# Patient Record
Sex: Female | Born: 1980 | Race: White | Hispanic: No | Marital: Married | Smoking: Current every day smoker
Health system: Southern US, Community
[De-identification: ages and names within clinical notes are randomized; demographics above are authoritative.]

## PROBLEM LIST (undated history)

## (undated) DIAGNOSIS — E039 Hypothyroidism, unspecified: Secondary | ICD-10-CM

## (undated) DIAGNOSIS — F419 Anxiety disorder, unspecified: Secondary | ICD-10-CM

## (undated) DIAGNOSIS — K219 Gastro-esophageal reflux disease without esophagitis: Secondary | ICD-10-CM

## (undated) DIAGNOSIS — F32A Depression, unspecified: Secondary | ICD-10-CM

## (undated) DIAGNOSIS — G932 Benign intracranial hypertension: Secondary | ICD-10-CM

## (undated) DIAGNOSIS — T8859XA Other complications of anesthesia, initial encounter: Secondary | ICD-10-CM

## (undated) DIAGNOSIS — I10 Essential (primary) hypertension: Secondary | ICD-10-CM

## (undated) DIAGNOSIS — Z87442 Personal history of urinary calculi: Secondary | ICD-10-CM

## (undated) HISTORY — PX: WISDOM TOOTH EXTRACTION: SHX21

---

## 2003-09-07 ENCOUNTER — Inpatient Hospital Stay (HOSPITAL_COMMUNITY): Admission: AD | Admit: 2003-09-07 | Discharge: 2003-09-08 | Payer: Self-pay | Admitting: Family Medicine

## 2016-05-12 ENCOUNTER — Ambulatory Visit (INDEPENDENT_AMBULATORY_CARE_PROVIDER_SITE_OTHER): Payer: BLUE CROSS/BLUE SHIELD | Admitting: Urology

## 2016-05-12 DIAGNOSIS — N200 Calculus of kidney: Secondary | ICD-10-CM | POA: Diagnosis not present

## 2016-05-12 DIAGNOSIS — R310 Gross hematuria: Secondary | ICD-10-CM

## 2016-05-14 ENCOUNTER — Other Ambulatory Visit: Payer: Self-pay | Admitting: Urology

## 2016-05-14 DIAGNOSIS — R310 Gross hematuria: Secondary | ICD-10-CM

## 2016-05-27 ENCOUNTER — Encounter (HOSPITAL_COMMUNITY): Payer: Self-pay | Admitting: Radiology

## 2016-05-27 ENCOUNTER — Ambulatory Visit (HOSPITAL_COMMUNITY)
Admission: RE | Admit: 2016-05-27 | Discharge: 2016-05-27 | Disposition: A | Discharge status: Home / Self Care | Payer: BLUE CROSS/BLUE SHIELD | Source: Ambulatory Visit | Attending: Urology | Admitting: Urology

## 2016-05-27 DIAGNOSIS — R310 Gross hematuria: Secondary | ICD-10-CM | POA: Insufficient documentation

## 2016-05-27 DIAGNOSIS — N132 Hydronephrosis with renal and ureteral calculous obstruction: Secondary | ICD-10-CM | POA: Diagnosis not present

## 2016-05-27 MED ORDER — IOPAMIDOL (ISOVUE-300) INJECTION 61%
INTRAVENOUS | Status: AC
  Administered 2016-05-27: 150 mL via INTRAVENOUS
  Filled 2016-05-27: qty 150

## 2016-05-27 MED ORDER — SODIUM CHLORIDE 0.9 % IV SOLN
INTRAVENOUS | Status: AC
  Filled 2016-05-27: qty 250

## 2016-06-02 ENCOUNTER — Other Ambulatory Visit: Payer: Self-pay | Admitting: Urology

## 2016-06-02 ENCOUNTER — Ambulatory Visit (INDEPENDENT_AMBULATORY_CARE_PROVIDER_SITE_OTHER): Payer: BLUE CROSS/BLUE SHIELD | Admitting: Urology

## 2016-06-02 DIAGNOSIS — N200 Calculus of kidney: Secondary | ICD-10-CM

## 2016-06-08 ENCOUNTER — Encounter (HOSPITAL_COMMUNITY): Payer: Self-pay | Admitting: *Deleted

## 2016-06-10 ENCOUNTER — Ambulatory Visit (HOSPITAL_COMMUNITY)
Admission: RE | Admit: 2016-06-10 | Discharge: 2016-06-10 | Disposition: A | Discharge status: Home / Self Care | Payer: BLUE CROSS/BLUE SHIELD | Source: Ambulatory Visit | Attending: Urology | Admitting: Urology

## 2016-06-10 ENCOUNTER — Encounter (HOSPITAL_COMMUNITY): Payer: Self-pay | Admitting: *Deleted

## 2016-06-10 ENCOUNTER — Encounter (HOSPITAL_COMMUNITY)
Admission: RE | Disposition: A | Discharge status: Home / Self Care | Payer: Self-pay | Source: Ambulatory Visit | Attending: Urology

## 2016-06-10 DIAGNOSIS — N201 Calculus of ureter: Secondary | ICD-10-CM | POA: Insufficient documentation

## 2016-06-10 DIAGNOSIS — Z87442 Personal history of urinary calculi: Secondary | ICD-10-CM | POA: Insufficient documentation

## 2016-06-10 DIAGNOSIS — E669 Obesity, unspecified: Secondary | ICD-10-CM | POA: Diagnosis not present

## 2016-06-10 DIAGNOSIS — I10 Essential (primary) hypertension: Secondary | ICD-10-CM | POA: Diagnosis not present

## 2016-06-10 DIAGNOSIS — Z6841 Body Mass Index (BMI) 40.0 and over, adult: Secondary | ICD-10-CM | POA: Insufficient documentation

## 2016-06-10 HISTORY — DX: Hypothyroidism, unspecified: E03.9

## 2016-06-10 HISTORY — DX: Personal history of urinary calculi: Z87.442

## 2016-06-10 HISTORY — PX: EXTRACORPOREAL SHOCK WAVE LITHOTRIPSY: SHX1557

## 2016-06-10 HISTORY — DX: Essential (primary) hypertension: I10

## 2016-06-10 HISTORY — DX: Benign intracranial hypertension: G93.2

## 2016-06-10 LAB — PREGNANCY, URINE: Preg Test, Ur: NEGATIVE

## 2016-06-10 SURGERY — LITHOTRIPSY, ESWL
Anesthesia: LOCAL | Laterality: Right

## 2016-06-10 MED ORDER — DIAZEPAM 5 MG PO TABS: 10.0000 mg | ORAL_TABLET | ORAL | Status: DC

## 2016-06-10 MED ORDER — CIPROFLOXACIN HCL 500 MG PO TABS
500.0000 mg | ORAL_TABLET | ORAL | Status: AC
  Administered 2016-06-10: 500 mg via ORAL
  Filled 2016-06-10: qty 1

## 2016-06-10 MED ORDER — DIPHENHYDRAMINE HCL 25 MG PO CAPS: 25.0000 mg | ORAL_CAPSULE | ORAL | Status: DC

## 2016-06-10 MED ORDER — SODIUM CHLORIDE 0.9 % IV SOLN
INTRAVENOUS | Status: DC
  Administered 2016-06-10: 10:00:00 via INTRAVENOUS

## 2016-06-11 ENCOUNTER — Encounter (HOSPITAL_COMMUNITY): Payer: Self-pay | Admitting: Urology

## 2016-06-23 ENCOUNTER — Other Ambulatory Visit (HOSPITAL_COMMUNITY)
Admission: RE | Admit: 2016-06-23 | Discharge: 2016-06-23 | Disposition: A | Discharge status: Home / Self Care | Payer: BLUE CROSS/BLUE SHIELD | Source: Ambulatory Visit | Attending: Urology | Admitting: Urology

## 2016-06-23 ENCOUNTER — Ambulatory Visit (INDEPENDENT_AMBULATORY_CARE_PROVIDER_SITE_OTHER): Payer: Self-pay | Admitting: Urology

## 2016-06-23 ENCOUNTER — Ambulatory Visit (HOSPITAL_COMMUNITY)
Admission: RE | Admit: 2016-06-23 | Discharge: 2016-06-23 | Disposition: A | Discharge status: Home / Self Care | Payer: BLUE CROSS/BLUE SHIELD | Source: Ambulatory Visit | Attending: Urology | Admitting: Urology

## 2016-06-23 ENCOUNTER — Other Ambulatory Visit: Payer: Self-pay | Admitting: Urology

## 2016-06-23 DIAGNOSIS — N200 Calculus of kidney: Secondary | ICD-10-CM

## 2016-07-02 LAB — STONE ANALYSIS
Ca Oxalate,Dihydrate: 10 %
Ca Oxalate,Monohydr.: 2 %
Ca phos cry stone ql IR: 88 %
Stone Weight KSTONE: 2.4 mg

## 2016-07-07 ENCOUNTER — Other Ambulatory Visit: Payer: Self-pay | Admitting: Urology

## 2016-07-07 ENCOUNTER — Ambulatory Visit (INDEPENDENT_AMBULATORY_CARE_PROVIDER_SITE_OTHER): Payer: Self-pay | Admitting: Urology

## 2016-07-07 ENCOUNTER — Ambulatory Visit (HOSPITAL_COMMUNITY)
Admission: RE | Admit: 2016-07-07 | Discharge: 2016-07-07 | Disposition: A | Discharge status: Home / Self Care | Payer: BLUE CROSS/BLUE SHIELD | Source: Ambulatory Visit | Attending: Urology | Admitting: Urology

## 2016-07-07 DIAGNOSIS — N201 Calculus of ureter: Secondary | ICD-10-CM | POA: Diagnosis not present

## 2016-07-07 DIAGNOSIS — N200 Calculus of kidney: Secondary | ICD-10-CM

## 2016-07-09 NOTE — Patient Instructions (Signed)
Nancy Villa  07/09/2016     @PREFPERIOPPHARMACY @   Your procedure is scheduled on  07/19/2016   Report to [facility] at  615  A.M.  Call this number if you have problems the morning of surgery:  [phone]   Remember:  Do not eat food or drink liquids after midnight.  Take these medicines the morning of surgery with A SIP OF WATER  Levothyroxine.   Do not wear jewelry, make-up or nail polish.  Do not wear lotions, powders, or perfumes, or deoderant.  Do not shave 48 hours prior to surgery.  Men may shave face and neck.  Do not bring valuables to the hospital.  [facility] is not responsible for any belongings or valuables.  Contacts, dentures or bridgework may not be worn into surgery.  Leave your suitcase in the car.  After surgery it may be brought to your room.  For patients admitted to the hospital, discharge time will be determined by your treatment team.  Patients discharged the day of surgery will not be allowed to drive home.   Name and phone number of your driver:   family Special instructions:  None  Please read over the following fact sheets that you were given. Anesthesia Post-op Instructions and Care and Recovery After Surgery       Cystoscopy Cystoscopy is a procedure that is used to help diagnose and sometimes treat conditions that affect that lower urinary tract. The lower urinary tract includes the bladder and the tube that drains urine from the bladder out of the body (urethra). Cystoscopy is performed with a thin, tube-shaped instrument with a light and camera at the end (cystoscope). The cystoscope may be hard (rigid) or flexible, depending on the goal of the procedure.The cystoscope is inserted through the urethra, into the bladder. Cystoscopy may be recommended if you have:  Urinary tractinfections that keep coming back (recurring).  Blood in the urine (hematuria).  Loss of bladder control (urinary  incontinence) or an overactive bladder.  Unusual cells found in a urine sample.  A blockage in the urethra.  Painful urination.  An abnormality in the bladder found during an intravenous pyelogram (IVP) or CT scan. Cystoscopy may also be done to remove a sample of tissue to be examined under a microscope (biopsy). Tell a health care provider about:  Any allergies you have.  All medicines you are taking, including vitamins, herbs, eye drops, creams, and over-the-counter medicines.  Any problems you or family members have had with anesthetic medicines.  Any blood disorders you have.  Any surgeries you have had.  Any medical conditions you have.  Whether you are pregnant or may be pregnant. What are the risks? Generally, this is a safe procedure. However, problems may occur, including:  Infection.  Bleeding.  Allergic reactions to medicines.  Damage to other structures or organs. What happens before the procedure?  Ask your health care provider about:  Changing or stopping your regular medicines. This is especially important if you are taking diabetes medicines or blood thinners.  Taking medicines such as aspirin and ibuprofen. These medicines can thin your blood. Do not take these medicines before your procedure if your health care provider instructs you not to.  Follow instructions from your health care provider about eating or drinking restrictions.  You may be given antibiotic medicine to help prevent infection.  You may have  an exam or testing, such as X-rays of the bladder, urethra, or kidneys.  You may have urine tests to check for signs of infection.  Plan to have someone take you home after the procedure. What happens during the procedure?  To reduce your risk of infection,your health care team will wash or sanitize their hands.  You will be given one or more of the following:  A medicine to help you relax (sedative).  A medicine to numb the area  (local anesthetic).  The area around the opening of your urethra will be cleaned.  The cystoscope will be passed through your urethra into your bladder.  Germ-free (sterile)fluid will flow through the cystoscope to fill your bladder. The fluid will stretch your bladder so that your surgeon can clearly examine your bladder walls.  The cystoscope will be removed and your bladder will be emptied. The procedure may vary among health care providers and hospitals. What happens after the procedure?  You may have some soreness or pain in your abdomen and urethra. Medicines will be available to help you.  You may have some blood in your urine.  Do not drive for 24 hours if you received a sedative. This information is not intended to replace advice given to you by your health care provider. Make sure you discuss any questions you have with your health care provider. Document Released: 02/27/2000 Document Revised: 07/10/2015 Document Reviewed: 01/16/2015 Elsevier Interactive Patient Education  2017 [employer]  Cystoscopy, Care After Refer to this sheet in the next few weeks. These instructions provide you with information about caring for yourself after your procedure. Your health care provider may also give you more specific instructions. Your treatment has been planned according to current medical practices, but problems sometimes occur. Call your health care provider if you have any problems or questions after your procedure. What can I expect after the procedure? After the procedure, it is common to have:  Mild pain when you urinate. Pain should stop within a few minutes after you urinate. This may last for up to 1 week.  A small amount of blood in your urine for several days.  Feeling like you need to urinate but producing only a small amount of urine. Follow these instructions at home:   Medicines   Take over-the-counter and prescription medicines only as told by your health care  provider.  If you were prescribed an antibiotic medicine, take it as told by your health care provider. Do not stop taking the antibiotic even if you start to feel better. General instructions    Return to your normal activities as told by your health care provider. Ask your health care provider what activities are safe for you.  Do not drive for 24 hours if you received a sedative.  Watch for any blood in your urine. If the amount of blood in your urine increases, call your health care provider.  Follow instructions from your health care provider about eating or drinking restrictions.  If a tissue sample was removed for testing (biopsy) during your procedure, it is your responsibility to get your test results. Ask your health care provider or the department performing the test when your results will be ready.  Drink enough fluid to keep your urine clear or pale yellow.  Keep all follow-up visits as told by your health care provider. This is important. Contact a health care provider if:  You have pain that gets worse or does not get better with medicine, especially  pain when you urinate.  You have difficulty urinating. Get help right away if:  You have more blood in your urine.  You have blood clots in your urine.  You have abdominal pain.  You have a fever or chills.  You are unable to urinate. This information is not intended to replace advice given to you by your health care provider. Make sure you discuss any questions you have with your health care provider. Document Released: 09/18/2004 Document Revised: 08/07/2015 Document Reviewed: 01/16/2015 Elsevier Interactive Patient Education  2017 [employer]  Ureteral Stent Implantation Ureteral stent implantation is a procedure to insert (implant) a flexible, soft, plastic tube (stent) into a tube (ureter) that drains urine from the kidneys. The stent supports the ureter while it heals and helps to drain urine from the  kidneys. You may have a ureteral stent implanted after having a procedure to remove a blockage from the ureter (ureterolysis or pyeloplasty).You may also have a stent implanted to open the flow of urine when you have a blockage caused by a kidney stone, tumor, blood clot, or infection. You have two ureters, one on each side of the body. The ureters connect the kidneys to the organ that holds urine until it passes out of the body (bladder). The stent is placed so that one end is in the kidney, and one end is in the bladder. The stent is usually taken out after your ureter has healed. Depending on your condition, you may have a stent for just a few weeks, or you may have a long-term stent that will need to be replaced every few months. Tell a health care provider about:  Any allergies you have.  All medicines you are taking, including vitamins, herbs, eye drops, creams, and over-the-counter medicines.  Any problems you or family members have had with anesthetic medicines.  Any blood disorders you have.  Any surgeries you have had.  Any medical conditions you have.  Whether you are pregnant or may be pregnant. What are the risks? Generally, this is a safe procedure. However, problems may occur, including:  Infection.  Bleeding.  Allergic reactions to medicines.  Damage to other structures or organs. Tearing (perforation) of the ureter is possible.  Movement of the stent away from where it is placed during surgery (migration). What happens before the procedure?  Ask your health care provider about:  Changing or stopping your regular medicines. This is especially important if you are taking diabetes medicines or blood thinners.  Taking medicines such as aspirin and ibuprofen. These medicines can thin your blood. Do not take these medicines before your procedure if your health care provider instructs you not to.  Follow instructions from your health care provider about eating or  drinking restrictions.  Do not drink alcohol and do not use any tobacco products before your procedure, as told by your health care provider.  You may be given antibiotic medicine to help prevent infection.  Plan to have someone take you home after the procedure.  If you go home right after the procedure, plan to have someone with you for 24 hours. What happens during the procedure?  An IV tube will be inserted into one of your veins.  You will be given a medicine to make you fall asleep (general anesthetic). You may also be given a medicine to help you relax (sedative).  A thin, tube-shaped instrument with a light and tiny camera at the end (cystoscope) will be inserted into your urethra. The urethra  is the tube that drains urine from the bladder out of the body. In men, the urethra opens at the end of the penis. In women, the urethra opens in front of the vaginal opening.  The cystoscope will be passed into your bladder.  A thin wire (guide wire) will be passed through your bladder and into your ureter. This is used to guide the stent into your ureter.  The stent will be inserted into your ureter.  The guide wire and the cystoscope will be removed.  A flexible tube (catheter) will be inserted through your urethra so that one end is in your bladder. This helps to drain urine from your bladder. The procedure may vary among hospitals and health care providers. What happens after the procedure?  Your blood pressure, heart rate, breathing rate, and blood oxygen level will be monitored often until the medicines you were given have worn off.  You may continue to receive medicine and fluids through an IV tube.  You may have some soreness or pain in your abdomen and urethra. Medicines will be available to help you.  You will be encouraged to get up and walk around as soon as you can.  You will have a catheter draining your urine.  You will have some blood in your urine.  Do not  drive for 24 hours if you received a sedative. This information is not intended to replace advice given to you by your health care provider. Make sure you discuss any questions you have with your health care provider. Document Released: 02/27/2000 Document Revised: 08/07/2015 Document Reviewed: 09/13/2014 Elsevier Interactive Patient Education  2017 [employer] Inc.  Ureteral Stent Implantation, Care After Refer to this sheet in the next few weeks. These instructions provide you with information about caring for yourself after your procedure. Your health care provider may also give you more specific instructions. Your treatment has been planned according to current medical practices, but problems sometimes occur. Call your health care provider if you have any problems or questions after your procedure. What can I expect after the procedure? After the procedure, it is common to have:  Nausea.  Mild pain when you urinate. You may feel this pain in your lower back or lower abdomen. Pain should stop within a few minutes after you urinate. This may last for up to 1 week.  A small amount of blood in your urine for several days. Follow these instructions at home:   Medicines   Take over-the-counter and prescription medicines only as told by your health care provider.  If you were prescribed an antibiotic medicine, take it as told by your health care provider. Do not stop taking the antibiotic even if you start to feel better.  Do not drive for 24 hours if you received a sedative.  Do not drive or operate heavy machinery while taking prescription pain medicines. Activity   Return to your normal activities as told by your health care provider. Ask your health care provider what activities are safe for you.  Do not lift anything that is heavier than 10 lb (4.5 kg). Follow this limit for 1 week after your procedure, or for as long as told by your health care provider. General instructions   Watch  for any blood in your urine. Call your health care provider if the amount of blood in your urine increases.  If you have a catheter:  Follow instructions from your health care provider about taking care of your catheter and collection  bag.  Do not take baths, swim, or use a hot tub until your health care provider approves.  Drink enough fluid to keep your urine clear or pale yellow.  Keep all follow-up visits as told by your health care provider. This is important. Contact a health care provider if:  You have pain that gets worse or does not get better with medicine, especially pain when you urinate.  You have difficulty urinating.  You feel nauseous or you vomit repeatedly during a period of more than 2 days after the procedure. Get help right away if:  Your urine is dark red or has blood clots in it.  You are leaking urine (have incontinence).  The end of the stent comes out of your urethra.  You cannot urinate.  You have sudden, sharp, or severe pain in your abdomen or lower back.  You have a fever. This information is not intended to replace advice given to you by your health care provider. Make sure you discuss any questions you have with your health care provider. Document Released: 11/01/2012 Document Revised: 08/07/2015 Document Reviewed: 09/13/2014 Elsevier Interactive Patient Education  2017 [employer]  Kidney Stones Kidney stones (urolithiasis) are rock-like masses that form inside of the kidneys. Kidneys are organs that make pee (urine). A kidney stone can cause very bad pain and can block the flow of pee. The stone usually leaves your body (passes) through your pee. You may need to have a doctor take out the stone. Follow these instructions at home: Eating and drinking   Drink enough fluid to keep your pee clear or pale yellow. This will help you pass the stone.  If told by your doctor, change the foods you eat (your diet). This may include:  Limiting how  much salt (sodium) you eat.  Eating more fruits and vegetables.  Limiting how much meat, poultry, fish, and eggs you eat.  Follow instructions from your doctor about eating or drinking restrictions. General instructions   Collect pee samples as told by your doctor. You may need to collect a pee sample:  24 hours after a stone comes out.  8-12 weeks after a stone comes out, and every 6-12 months after that.  Strain your pee every time you pee (urinate), for as long as told. Use the strainer that your doctor recommends.  Do not throw out the stone. Keep it so that it can be tested by your doctor.  Take over-the-counter and prescription medicines only as told by your doctor.  Keep all follow-up visits as told by your doctor. This is important. You may need follow-up tests. Preventing kidney stones  To prevent another kidney stone:  Drink enough fluid to keep your pee clear or pale yellow. This is the best way to prevent kidney stones.  Eat healthy foods.  Avoid certain foods as told by your doctor. You may be told to eat less protein.  Stay at a healthy weight. Contact a doctor if:  You have pain that gets worse or does not get better with medicine. Get help right away if:  You have a fever or chills.  You get very bad pain.  You get new pain in your belly (abdomen).  You pass out (faint).  You cannot pee. This information is not intended to replace advice given to you by your health care provider. Make sure you discuss any questions you have with your health care provider. Document Released: 08/18/2007 Document Revised: 11/18/2015 Document Reviewed: 11/18/2015 Elsevier Interactive Patient  Education  2017 [employer]  Kidney Stones Kidney stones (urolithiasis) are rock-like masses that form inside of the kidneys. Kidneys are organs that make pee (urine). A kidney stone can cause very bad pain and can block the flow of pee. The stone usually leaves your body (passes)  through your pee. You may need to have a doctor take out the stone. Follow these instructions at home: Eating and drinking   Drink enough fluid to keep your pee clear or pale yellow. This will help you pass the stone.  If told by your doctor, change the foods you eat (your diet). This may include:  Limiting how much salt (sodium) you eat.  Eating more fruits and vegetables.  Limiting how much meat, poultry, fish, and eggs you eat.  Follow instructions from your doctor about eating or drinking restrictions. General instructions   Collect pee samples as told by your doctor. You may need to collect a pee sample:  24 hours after a stone comes out.  8-12 weeks after a stone comes out, and every 6-12 months after that.  Strain your pee every time you pee (urinate), for as long as told. Use the strainer that your doctor recommends.  Do not throw out the stone. Keep it so that it can be tested by your doctor.  Take over-the-counter and prescription medicines only as told by your doctor.  Keep all follow-up visits as told by your doctor. This is important. You may need follow-up tests. Preventing kidney stones  To prevent another kidney stone:  Drink enough fluid to keep your pee clear or pale yellow. This is the best way to prevent kidney stones.  Eat healthy foods.  Avoid certain foods as told by your doctor. You may be told to eat less protein.  Stay at a healthy weight. Contact a doctor if:  You have pain that gets worse or does not get better with medicine. Get help right away if:  You have a fever or chills.  You get very bad pain.  You get new pain in your belly (abdomen).  You pass out (faint).  You cannot pee. This information is not intended to replace advice given to you by your health care provider. Make sure you discuss any questions you have with your health care provider. Document Released: 08/18/2007 Document Revised: 11/18/2015 Document Reviewed:  11/18/2015 Elsevier Interactive Patient Education  2017 [employer]  Ureteroscopy Ureteroscopy is a procedure to check for and treat problems inside part of the urinary tract. In this procedure, a thin, tube-shaped instrument with a light at the end (ureteroscope) is used to look at the inside of the kidneys and the ureters, which are the tubes that carry urine from the kidneys to the bladder. The ureteroscope is inserted into one or both of the ureters. You may need this procedure if you have frequent urinary tract infections (UTIs), blood in your urine, or a stone in one of your ureters. A ureteroscopy can be done to find the cause of urine blockage in a ureter and to evaluate other abnormalities inside the ureters or kidneys. If stones are found, they can be removed during the procedure. Polyps, abnormal tissue, and some types of tumors can also be removed or treated. The ureteroscope may also have a tool to remove tissue to be checked for disease under a microscope (biopsy). Tell a health care provider about:  Any allergies you have.  All medicines you are taking, including vitamins, herbs, eye drops, creams, and  over-the-counter medicines.  Any problems you or family members have had with anesthetic medicines.  Any blood disorders you have.  Any surgeries you have had.  Any medical conditions you have.  Whether you are pregnant or may be pregnant. What are the risks? Generally, this is a safe procedure. However, problems may occur, including:  Bleeding.  Infection.  Allergic reactions to medicines.  Scarring that narrows the ureter (stricture).  Creating a hole in the ureter (perforation). What happens before the procedure? Staying hydrated  Follow instructions from your health care provider about hydration, which may include:  Up to 2 hours before the procedure - you may continue to drink clear liquids, such as water, clear fruit juice, black coffee, and plain  tea. Eating and drinking restrictions  Follow instructions from your health care provider about eating and drinking, which may include:  8 hours before the procedure - stop eating heavy meals or foods such as meat, fried foods, or fatty foods.  6 hours before the procedure - stop eating light meals or foods, such as toast or cereal.  6 hours before the procedure - stop drinking milk or drinks that contain milk.  2 hours before the procedure - stop drinking clear liquids. Medicines   Ask your health care provider about:  Changing or stopping your regular medicines. This is especially important if you are taking diabetes medicines or blood thinners.  Taking medicines such as aspirin and ibuprofen. These medicines can thin your blood. Do not take these medicines before your procedure if your health care provider instructs you not to.  You may be given antibiotic medicine to help prevent infection. General instructions   You may have a urine sample taken to check for infection.  Plan to have someone take you home from the hospital or clinic. What happens during the procedure?  To reduce your risk of infection:  Your health care team will wash or sanitize their hands.  Your skin will be washed with soap.  An IV tube will be inserted into one of your veins.  You will be given one of the following:  A medicine to help you relax (sedative).  A medicine to make you fall asleep (general anesthetic).  A medicine that is injected into your spine to numb the area below and slightly above the injection site (spinal anesthetic).  To lower your risk of infection, you may be given an antibiotic medicine by an injection or through the IV tube.  The opening from which you urinate (urethra) will be cleaned with a germ-killing solution.  The ureteroscope will be passed through your urethra into your bladder.  A salt-water solution will flow through the ureteroscope to fill your bladder.  This will help the health care provider see the openings of your ureters more clearly.  Then, the ureteroscope will be passed into your ureter.  If a growth is found, a piece of it may be removed so it can be examined under a microscope (biopsy).  If a stone is found, it may be removed through the ureteroscope, or the stone may be broken up using a laser, shock waves, or electrical energy.  In some cases, if the ureter is too small, a tube may be inserted that keeps the ureter open (ureteral stent). The stent may be left in place for 1 or 2 weeks to keep the ureter open, and then the ureteroscopy procedure will be performed.  The scope will be removed, and your bladder will be emptied.  The procedure may vary among health care providers and hospitals. What happens after the procedure?  Your blood pressure, heart rate, breathing rate, and blood oxygen level will be monitored until the medicines you were given have worn off.  You may be asked to urinate.  Donot drive for 24 hours if you were given a sedative. This information is not intended to replace advice given to you by your health care provider. Make sure you discuss any questions you have with your health care provider. Document Released: 03/06/2013 Document Revised: 12/16/2015 Document Reviewed: 12/12/2015 Elsevier Interactive Patient Education  2017 [employer]  Ureteroscopy, Care After This sheet gives you information about how to care for yourself after your procedure. Your health care provider may also give you more specific instructions. If you have problems or questions, contact your health care provider. What can I expect after the procedure? After the procedure, it is common to have:  A burning sensation when you urinate.  Blood in your urine.  Mild discomfort in the bladder area or kidney area when urinating.  Needing to urinate more often or urgently. Follow these instructions at home: Medicines   Take  over-the-counter and prescription medicines only as told by your health care provider.  If you were prescribed an antibiotic medicine, take it as told by your health care provider. Do not stop taking the antibiotic even if you start to feel better. General instructions    Donot drive for 24 hours if you were given a medicine to help you relax (sedative) during your procedure.  To relieve burning, try taking a warm bath or holding a warm washcloth over your groin.  Drink enough fluid to keep your urine clear or pale yellow.  Drink two 8-ounce glasses of water every hour for the first 2 hours after you get home.  Continue to drink water often at home.  You can eat what you usually do.  Keep all follow-up visits as told by your health care provider. This is important.  If you had a tube placed to keep urine flowing (ureteral stent), ask your health care provider when you need to return to have it removed. Contact a health care provider if:  You have chills or a fever.  You have burning pain for longer than 24 hours after the procedure.  You have blood in your urine for longer than 24 hours after the procedure. Get help right away if:  You have large amounts of blood in your urine.  You have blood clots in your urine.  You have very bad pain.  You have chest pain or trouble breathing.  You are unable to urinate and you have the feeling of a full bladder. This information is not intended to replace advice given to you by your health care provider. Make sure you discuss any questions you have with your health care provider. Document Released: 03/06/2013 Document Revised: 12/16/2015 Document Reviewed: 12/12/2015 Elsevier Interactive Patient Education  2017 [employer] PATIENT INSTRUCTIONS POST-ANESTHESIA  IMMEDIATELY FOLLOWING SURGERY:  Do not drive or operate machinery for the first twenty four hours after surgery.  Do not make any important decisions for twenty four hours  after surgery or while taking narcotic pain medications or sedatives.  If you develop intractable nausea and vomiting or a severe headache please notify your doctor immediately.  FOLLOW-UP:  Please make an appointment with your surgeon as instructed. You do not need to follow up with anesthesia unless specifically instructed to do so.  WOUND CARE INSTRUCTIONS (if applicable):  Keep a dry clean dressing on the anesthesia/puncture wound site if there is drainage.  Once the wound has quit draining you may leave it open to air.  Generally you should leave the bandage intact for twenty four hours unless there is drainage.  If the epidural site drains for more than 36-48 hours please call the anesthesia department.  QUESTIONS?:  Please feel free to call your physician or the hospital operator if you have any questions, and they will be happy to assist you.

## 2016-07-13 ENCOUNTER — Encounter (HOSPITAL_COMMUNITY)
Admission: RE | Admit: 2016-07-13 | Discharge: 2016-07-13 | Disposition: A | Discharge status: Home / Self Care | Payer: BLUE CROSS/BLUE SHIELD | Source: Ambulatory Visit | Attending: Urology | Admitting: Urology

## 2016-07-13 ENCOUNTER — Encounter (HOSPITAL_COMMUNITY): Payer: Self-pay

## 2016-07-13 ENCOUNTER — Other Ambulatory Visit (HOSPITAL_COMMUNITY): Payer: BLUE CROSS/BLUE SHIELD

## 2016-07-13 DIAGNOSIS — Z01812 Encounter for preprocedural laboratory examination: Secondary | ICD-10-CM | POA: Diagnosis present

## 2016-07-13 DIAGNOSIS — Z01810 Encounter for preprocedural cardiovascular examination: Secondary | ICD-10-CM | POA: Insufficient documentation

## 2016-07-13 LAB — CBC WITH DIFFERENTIAL/PLATELET
Basophils Absolute: 0.1 10*3/uL (ref 0.0–0.1)
Basophils Relative: 1 %
Eosinophils Absolute: 0.5 10*3/uL (ref 0.0–0.7)
Eosinophils Relative: 5 %
HEMATOCRIT: 43.4 % (ref 36.0–46.0)
HEMOGLOBIN: 14.7 g/dL (ref 12.0–15.0)
LYMPHS ABS: 2.9 10*3/uL (ref 0.7–4.0)
Lymphocytes Relative: 29 %
MCH: 31.5 pg (ref 26.0–34.0)
MCHC: 33.9 g/dL (ref 30.0–36.0)
MCV: 93.1 fL (ref 78.0–100.0)
MONOS PCT: 8 %
Monocytes Absolute: 0.8 10*3/uL (ref 0.1–1.0)
NEUTROS ABS: 5.7 10*3/uL (ref 1.7–7.7)
NEUTROS PCT: 57 %
Platelets: 322 10*3/uL (ref 150–400)
RBC: 4.66 MIL/uL (ref 3.87–5.11)
RDW: 12.9 % (ref 11.5–15.5)
WBC: 10 10*3/uL (ref 4.0–10.5)

## 2016-07-13 LAB — BASIC METABOLIC PANEL
Anion gap: 6 (ref 5–15)
BUN: 10 mg/dL (ref 6–20)
CHLORIDE: 106 mmol/L (ref 101–111)
CO2: 23 mmol/L (ref 22–32)
CREATININE: 0.82 mg/dL (ref 0.44–1.00)
Calcium: 9 mg/dL (ref 8.9–10.3)
GFR calc Af Amer: >60 mL/min (ref >60)
GFR calc non Af Amer: >60 mL/min (ref >60)
GLUCOSE: 99 mg/dL (ref 65–99)
Potassium: 3.6 mmol/L (ref 3.5–5.1)
Sodium: 135 mmol/L (ref 135–145)

## 2016-07-13 LAB — HCG, SERUM, QUALITATIVE: Preg, Serum: NEGATIVE

## 2016-07-19 ENCOUNTER — Ambulatory Visit (HOSPITAL_COMMUNITY): Payer: BLUE CROSS/BLUE SHIELD

## 2016-07-19 ENCOUNTER — Encounter (HOSPITAL_COMMUNITY): Payer: Self-pay | Admitting: *Deleted

## 2016-07-19 ENCOUNTER — Encounter (HOSPITAL_COMMUNITY)
Admission: RE | Disposition: A | Discharge status: Home / Self Care | Payer: Self-pay | Source: Ambulatory Visit | Attending: Urology

## 2016-07-19 ENCOUNTER — Ambulatory Visit (HOSPITAL_COMMUNITY): Payer: BLUE CROSS/BLUE SHIELD | Admitting: Anesthesiology

## 2016-07-19 ENCOUNTER — Ambulatory Visit (HOSPITAL_COMMUNITY)
Admission: RE | Admit: 2016-07-19 | Discharge: 2016-07-19 | Disposition: A | Discharge status: Home / Self Care | Payer: BLUE CROSS/BLUE SHIELD | Source: Ambulatory Visit | Attending: Urology | Admitting: Urology

## 2016-07-19 DIAGNOSIS — F17210 Nicotine dependence, cigarettes, uncomplicated: Secondary | ICD-10-CM | POA: Insufficient documentation

## 2016-07-19 DIAGNOSIS — Z87442 Personal history of urinary calculi: Secondary | ICD-10-CM | POA: Diagnosis not present

## 2016-07-19 DIAGNOSIS — I10 Essential (primary) hypertension: Secondary | ICD-10-CM | POA: Insufficient documentation

## 2016-07-19 DIAGNOSIS — E039 Hypothyroidism, unspecified: Secondary | ICD-10-CM | POA: Insufficient documentation

## 2016-07-19 DIAGNOSIS — N201 Calculus of ureter: Secondary | ICD-10-CM

## 2016-07-19 DIAGNOSIS — N132 Hydronephrosis with renal and ureteral calculous obstruction: Secondary | ICD-10-CM | POA: Diagnosis not present

## 2016-07-19 HISTORY — PX: CYSTOSCOPY WITH RETROGRADE PYELOGRAM, URETEROSCOPY AND STENT PLACEMENT: SHX5789

## 2016-07-19 HISTORY — PX: HOLMIUM LASER APPLICATION: SHX5852

## 2016-07-19 SURGERY — CYSTOURETEROSCOPY, WITH RETROGRADE PYELOGRAM AND STENT INSERTION
Anesthesia: General | Laterality: Right

## 2016-07-19 MED ORDER — OXYBUTYNIN CHLORIDE 5 MG PO TABS: 5.0000 mg | ORAL_TABLET | Freq: Three times a day (TID) | ORAL | 0 refills | Status: DC | PRN

## 2016-07-19 MED ORDER — PHENAZOPYRIDINE HCL 100 MG PO TABS: 100.0000 mg | ORAL_TABLET | Freq: Three times a day (TID) | ORAL | 0 refills | Status: DC | PRN

## 2016-07-19 MED ORDER — FENTANYL CITRATE (PF) 100 MCG/2ML IJ SOLN
INTRAMUSCULAR | Status: AC
  Filled 2016-07-19: qty 4

## 2016-07-19 MED ORDER — SODIUM CHLORIDE 0.9 % IJ SOLN
INTRAMUSCULAR | Status: AC
  Filled 2016-07-19: qty 10

## 2016-07-19 MED ORDER — LACTATED RINGERS IV SOLN
INTRAVENOUS | Status: DC
  Administered 2016-07-19 (×2): via INTRAVENOUS

## 2016-07-19 MED ORDER — SODIUM CHLORIDE 0.9 % IR SOLN
Status: DC | PRN
  Administered 2016-07-19 (×2): 3000 mL

## 2016-07-19 MED ORDER — MIDAZOLAM HCL 2 MG/2ML IJ SOLN
1.0000 mg | INTRAMUSCULAR | Status: AC
Start: 2016-07-19 — End: 2016-07-19
  Administered 2016-07-19: 2 mg via INTRAVENOUS

## 2016-07-19 MED ORDER — DIATRIZOATE MEGLUMINE 30 % UR SOLN
URETHRAL | Status: AC
  Filled 2016-07-19: qty 300

## 2016-07-19 MED ORDER — ONDANSETRON HCL 4 MG/2ML IJ SOLN
INTRAMUSCULAR | Status: AC
  Filled 2016-07-19: qty 2

## 2016-07-19 MED ORDER — ONDANSETRON HCL 4 MG/2ML IJ SOLN
4.0000 mg | Freq: Once | INTRAMUSCULAR | Status: AC
  Administered 2016-07-19: 4 mg via INTRAVENOUS

## 2016-07-19 MED ORDER — MIDAZOLAM HCL 2 MG/2ML IJ SOLN
INTRAMUSCULAR | Status: AC
  Filled 2016-07-19: qty 2

## 2016-07-19 MED ORDER — EPHEDRINE SULFATE 50 MG/ML IJ SOLN
INTRAMUSCULAR | Status: AC
  Filled 2016-07-19: qty 1

## 2016-07-19 MED ORDER — TAMSULOSIN HCL 0.4 MG PO CAPS: 0.4000 mg | ORAL_CAPSULE | Freq: Every day | ORAL | 0 refills | Status: DC

## 2016-07-19 MED ORDER — STERILE WATER FOR IRRIGATION IR SOLN
Status: DC | PRN
  Administered 2016-07-19: 1000 mL

## 2016-07-19 MED ORDER — PROPOFOL 10 MG/ML IV BOLUS
INTRAVENOUS | Status: DC | PRN
  Administered 2016-07-19: 150 mg via INTRAVENOUS

## 2016-07-19 MED ORDER — LIDOCAINE HCL 1 % IJ SOLN
INTRAMUSCULAR | Status: DC | PRN
  Administered 2016-07-19: 30 mg via INTRADERMAL

## 2016-07-19 MED ORDER — DIAZEPAM 5 MG PO TABS: 5.0000 mg | ORAL_TABLET | Freq: Three times a day (TID) | ORAL | 0 refills | Status: DC | PRN

## 2016-07-19 MED ORDER — FENTANYL CITRATE (PF) 100 MCG/2ML IJ SOLN
INTRAMUSCULAR | Status: DC | PRN
  Administered 2016-07-19: 25 ug via INTRAVENOUS
  Administered 2016-07-19: 50 ug via INTRAVENOUS
  Administered 2016-07-19: 25 ug via INTRAVENOUS

## 2016-07-19 MED ORDER — LIDOCAINE HCL (PF) 1 % IJ SOLN
INTRAMUSCULAR | Status: AC
  Filled 2016-07-19: qty 5

## 2016-07-19 MED ORDER — CEFAZOLIN SODIUM-DEXTROSE 2-4 GM/100ML-% IV SOLN
2.0000 g | INTRAVENOUS | Status: AC
  Administered 2016-07-19: 2 g via INTRAVENOUS
  Filled 2016-07-19: qty 100

## 2016-07-19 MED ORDER — PROPOFOL 10 MG/ML IV BOLUS
INTRAVENOUS | Status: AC
  Filled 2016-07-19: qty 40

## 2016-07-19 MED ORDER — OXYCODONE-ACETAMINOPHEN 10-325 MG PO TABS: 1.0000 | ORAL_TABLET | ORAL | 0 refills | Status: DC | PRN

## 2016-07-19 MED ORDER — MIDAZOLAM HCL 5 MG/5ML IJ SOLN
INTRAMUSCULAR | Status: DC | PRN
  Administered 2016-07-19: 2 mg via INTRAVENOUS

## 2016-07-19 MED ORDER — DIATRIZOATE MEGLUMINE 30 % UR SOLN
URETHRAL | Status: DC | PRN
  Administered 2016-07-19: 4 mL
  Administered 2016-07-19: 10 mL

## 2016-07-19 MED ORDER — HYDROMORPHONE HCL 1 MG/ML IJ SOLN: 0.2500 mg | INTRAMUSCULAR | Status: DC | PRN

## 2016-07-19 SURGICAL SUPPLY — 27 items
BAG DRAIN CYSTO-URO STER (DRAIN) ×3 IMPLANT
BAG HAMPER (MISCELLANEOUS) ×3 IMPLANT
CATH FOLEY 2WAY  3CC  8FR (CATHETERS) ×2
CATH FOLEY 2WAY 3CC 8FR (CATHETERS) IMPLANT
CATH INTERMIT  6FR 70CM (CATHETERS) ×2 IMPLANT
CLOTH BEACON ORANGE TIMEOUT ST (SAFETY) ×3 IMPLANT
EXTRACTOR STONE NITINOL NGAGE (UROLOGICAL SUPPLIES) ×3 IMPLANT
FIBER LASER FLEXIVA 200 (UROLOGICAL SUPPLIES) ×3 IMPLANT
GLOVE BIO SURGEON STRL SZ8 (GLOVE) ×3 IMPLANT
GLOVE BIOGEL PI IND STRL 6.5 (GLOVE) IMPLANT
GLOVE BIOGEL PI IND STRL 7.0 (GLOVE) IMPLANT
GLOVE BIOGEL PI INDICATOR 6.5 (GLOVE) ×2
GLOVE BIOGEL PI INDICATOR 7.0 (GLOVE) ×2
GLOVE SURG SS PI 6.5 STRL IVOR (GLOVE) ×2 IMPLANT
GOWN STRL REUS W/ TWL XL LVL3 (GOWN DISPOSABLE) ×1 IMPLANT
GOWN STRL REUS W/TWL LRG LVL3 (GOWN DISPOSABLE) ×3 IMPLANT
GOWN STRL REUS W/TWL XL LVL3 (GOWN DISPOSABLE) ×3
GUIDEWIRE ANG ZIPWIRE 038X150 (WIRE) ×3 IMPLANT
GUIDEWIRE STR DUAL SENSOR (WIRE) ×2 IMPLANT
GUIDEWIRE STR ZIPWIRE 035X150 (MISCELLANEOUS) ×3 IMPLANT
IV NS IRRIG 3000ML ARTHROMATIC (IV SOLUTION) ×5 IMPLANT
MANIFOLD NEPTUNE II (INSTRUMENTS) ×2 IMPLANT
PACK CYSTO (CUSTOM PROCEDURE TRAY) ×3 IMPLANT
PAD ARMBOARD 7.5X6 YLW CONV (MISCELLANEOUS) ×3 IMPLANT
STENT URET 6FRX26 CONTOUR (STENTS) ×2 IMPLANT
SYRINGE 10CC LL (SYRINGE) ×3 IMPLANT
TOWEL OR 17X26 4PK STRL BLUE (TOWEL DISPOSABLE) ×3 IMPLANT

## 2016-07-19 NOTE — Anesthesia Procedure Notes (Signed)
Procedure Name: LMA Insertion Date/Time: 07/19/2016 7:44 AM Performed by: Despina HiddenIDACAVAGE, Heliodoro Domagalski J Pre-anesthesia Checklist: Patient identified, Patient being monitored, Emergency Drugs available, Timeout performed and Suction available Patient Re-evaluated:Patient Re-evaluated prior to inductionOxygen Delivery Method: Circle System Utilized Preoxygenation: Pre-oxygenation with 100% oxygen Intubation Type: IV induction Ventilation: Mask ventilation without difficulty LMA: LMA inserted LMA Size: 4.0 Number of attempts: 1 Placement Confirmation: positive ETCO2 and breath sounds checked- equal and bilateral Tube secured with: Tape Dental Injury: Teeth and Oropharynx as per pre-operative assessment

## 2016-07-19 NOTE — Discharge Instructions (Signed)
Ureteral Stent Implantation, Care After °Refer to this sheet in the next few weeks. These instructions provide you with information about caring for yourself after your procedure. Your health care provider may also give you more specific instructions. Your treatment has been planned according to current medical practices, but problems sometimes occur. Call your health care provider if you have any problems or questions after your procedure. °What can I expect after the procedure? °After the procedure, it is common to have: °· Nausea. °· Mild pain when you urinate. You may feel this pain in your lower back or lower abdomen. Pain should stop within a few minutes after you urinate. This may last for up to 1 week. °· A small amount of blood in your urine for several days. °Follow these instructions at home: °  °Medicines  °· Take over-the-counter and prescription medicines only as told by your health care provider. °· If you were prescribed an antibiotic medicine, take it as told by your health care provider. Do not stop taking the antibiotic even if you start to feel better. °· Do not drive for 24 hours if you received a sedative. °· Do not drive or operate heavy machinery while taking prescription pain medicines. °Activity  °· Return to your normal activities as told by your health care provider. Ask your health care provider what activities are safe for you. °· Do not lift anything that is heavier than 10 lb (4.5 kg). Follow this limit for 1 week after your procedure, or for as long as told by your health care provider. °General instructions  °· Watch for any blood in your urine. Call your health care provider if the amount of blood in your urine increases. °· If you have a catheter: °¨ Follow instructions from your health care provider about taking care of your catheter and collection bag. °¨ Do not take baths, swim, or use a hot tub until your health care provider approves. °· Drink enough fluid to keep your urine  clear or pale yellow. °· Keep all follow-up visits as told by your health care provider. This is important. °Contact a health care provider if: °· You have pain that gets worse or does not get better with medicine, especially pain when you urinate. °· You have difficulty urinating. °· You feel nauseous or you vomit repeatedly during a period of more than 2 days after the procedure. °Get help right away if: °· Your urine is dark red or has blood clots in it. °· You are leaking urine (have incontinence). °· The end of the stent comes out of your urethra. °· You cannot urinate. °· You have sudden, sharp, or severe pain in your abdomen or lower back. °· You have a fever. °This information is not intended to replace advice given to you by your health care provider. Make sure you discuss any questions you have with your health care provider. °Document Released: 11/01/2012 Document Revised: 08/07/2015 Document Reviewed: 09/13/2014 °Elsevier Interactive Patient Education © 2017 [employer] ° ° °PATIENT INSTRUCTIONS °POST-ANESTHESIA ° °IMMEDIATELY FOLLOWING SURGERY:  Do not drive or operate machinery for the first twenty four hours after surgery.  Do not make any important decisions for twenty four hours after surgery or while taking narcotic pain medications or sedatives.  If you develop intractable nausea and vomiting or a severe headache please notify your doctor immediately. ° °FOLLOW-UP:  Please make an appointment with your surgeon as instructed. You do not need to follow up with anesthesia unless specifically   instructed to do so. ° °WOUND CARE INSTRUCTIONS (if applicable):  Keep a dry clean dressing on the anesthesia/puncture wound site if there is drainage.  Once the wound has quit draining you may leave it open to air.  Generally you should leave the bandage intact for twenty four hours unless there is drainage.  If the epidural site drains for more than 36-48 hours please call the anesthesia  department. ° °QUESTIONS?:  Please feel free to call your physician or the hospital operator if you have any questions, and they will be happy to assist you.    ° ° ° °

## 2016-07-19 NOTE — Transfer of Care (Signed)
Immediate Anesthesia Transfer of Care Note  Patient: Nancy Villa  Procedure(s) Performed: Procedure(s): CYSTOSCOPY WITH RIGHT RETROGRADE PYELOGRAM, RIGHT URETEROSCOPY WITH LASER LITHOTRIPSY RIGHT URETERAL CALCULUS, RIGHT URETERAL STENT PLACEMENT (Right) HOLMIUM LASER APPLICATION (Right)  Patient Location: PACU  Anesthesia Type:General  Level of Consciousness: awake and patient cooperative  Airway & Oxygen Therapy: Patient Spontanous Breathing and Patient connected to face mask oxygen  Post-op Assessment: Report given to RN, Post -op Vital signs reviewed and stable and Patient moving all extremities  Post vital signs: Reviewed and stable  Last Vitals:  Vitals:   07/19/16 0730 07/19/16 0731  BP:    Pulse:    Resp: (!) 22 (!) 26  Temp:      Last Pain:  Vitals:   07/19/16 0641  TempSrc: Oral      Patients Stated Pain Goal: 5 (07/19/16 0641)  Complications: No apparent anesthesia complications

## 2016-07-19 NOTE — Anesthesia Preprocedure Evaluation (Signed)
Anesthesia Evaluation  Patient identified by MRN, date of birth, ID band Patient awake    Reviewed: Allergy & Precautions, NPO status , Patient's Chart, lab work & pertinent test results  Airway Mallampati: I  TM Distance: >3 FB     Dental  (+) Teeth Intact   Pulmonary Current Smoker,    breath sounds clear to auscultation       Cardiovascular hypertension, Pt. on medications  Rhythm:Regular Rate:Normal     Neuro/Psych    GI/Hepatic negative GI ROS,   Endo/Other  Hypothyroidism Morbid obesity  Renal/GU      Musculoskeletal   Abdominal   Peds  Hematology   Anesthesia Other Findings   Reproductive/Obstetrics                             Anesthesia Physical Anesthesia Plan  ASA: II  Anesthesia Plan: General   Post-op Pain Management:    Induction: Intravenous  Airway Management Planned: LMA  Additional Equipment:   Intra-op Plan:   Post-operative Plan: Extubation in OR  Informed Consent: I have reviewed the patients History and Physical, chart, labs and discussed the procedure including the risks, benefits and alternatives for the proposed anesthesia with the patient or authorized representative who has indicated his/her understanding and acceptance.     Plan Discussed with:   Anesthesia Plan Comments:         Anesthesia Quick Evaluation

## 2016-07-19 NOTE — Progress Notes (Signed)
Patient had plastic retainer nose ring in place during preop. When arrived to post op area, nose ring was gone. Dr. Ronne BinningMcKenzie notified.

## 2016-07-19 NOTE — Anesthesia Postprocedure Evaluation (Signed)
Anesthesia Post Note  Patient: Nancy Villa  Procedure(s) Performed: Procedure(s) (LRB): CYSTOSCOPY WITH RIGHT RETROGRADE PYELOGRAM, RIGHT URETEROSCOPY WITH LASER LITHOTRIPSY RIGHT URETERAL CALCULUS, RIGHT URETERAL STENT PLACEMENT (Right) HOLMIUM LASER APPLICATION (Right)  Patient location during evaluation: PACU Anesthesia Type: General Level of consciousness: awake, oriented and patient cooperative Pain management: pain level controlled Vital Signs Assessment: post-procedure vital signs reviewed and stable Respiratory status: spontaneous breathing, nonlabored ventilation and respiratory function stable Cardiovascular status: blood pressure returned to baseline Postop Assessment: no signs of nausea or vomiting Anesthetic complications: no     Last Vitals:  Vitals:   07/19/16 0730 07/19/16 0731  BP:    Pulse:    Resp: (!) 22 (!) 26  Temp:      Last Pain:  Vitals:   07/19/16 0641  TempSrc: Oral                 Adamariz Gillott J

## 2016-07-19 NOTE — Op Note (Signed)
Preoperative diagnosis: Right ureteral stone  Postoperative diagnosis: Same  Procedure: 1 cystoscopy 2 right retrograde pyelography 3.  Intraoperative fluoroscopy, under one hour, with interpretation 4.  Right ureteroscopic stone manipulation with laser lithotripsy 5.  Right 6 x 26 JJ stent placement  Attending: Cleda MccreedyPatrick Mackenzie  Anesthesia: General  Estimated blood loss: None  Drains: Right 6 x 26 JJ ureteral stent without tether  Specimens: stone for analysis  Antibiotics: ancef  Findings: impacted right mid ureteral stone.  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  After discussing treatment options, she decided proceed with right ureteroscopic stone manipulation.  Procedure her in detail: The patient was brought to the operating room and a brief timeout was done to ensure correct patient, correct procedure, correct site.  General anesthesia was administered patient was placed in dorsal lithotomy position.  Her genitalia was then prepped and draped in usual sterile fashion.  A rigid 22 French cystoscope was passed in the urethra and the bladder.  Bladder was inspected free masses or lesions.  the right ureteral orifices were in the normal orthotopic locations.  a 6 french ureteral catheter was then instilled into the right ureter orifice.  a gentle retrograde was obtained and findings noted above.  we then placed a zip wire through the ureteral catheter and advanced up to the renal pelvis.  we then removed the cystoscope and cannulated the right ureteral orifice with a semirigid ureteroscope.  we then encountered the stone in the mid ureter.  using using a 200 nm laser fiber and fragmented the stone into smaller pieces.  the pieces were then removed with a engage basket.  We then placed and good coil was noted in the the renal pelvis under fluoroscopy and the bladder under direct  vision.   once all stone fragments were removed we then placed a 6 x 26 double-j ureteral stent over the original zip wire.  the stone fragments were then removed from the bladder and sent for analysis.   the bladder was then drained and this concluded the procedure which was well tolerated by patient.  Complications: None  Condition: Stable, extubated, transferred to PACU  Plan: Patient is to be discharged home as to follow-up in 2 weeks for stent removal.

## 2016-07-19 NOTE — H&P (Signed)
Urology Admission H&P  Chief Complaint: right ureteral calculus  History of Present Illness: Ms Nancy Villa is a 35yo with a right ureteral calculus who is s/p R ESWL and has failed to pass the fragments  Past Medical History:  Diagnosis Date  . History of kidney stones   . Hypertension    intercranial hypertension  . Hypothyroidism   . Intracranial hypertension    Past Surgical History:  Procedure Laterality Date  . EXTRACORPOREAL SHOCK WAVE LITHOTRIPSY Right 06/10/2016   Procedure: RIGHT EXTRACORPOREAL SHOCK WAVE LITHOTRIPSY (ESWL);  Surgeon: Malen GauzePatrick L Shirl Weir, MD;  Location: [facility];  Service: Urology;  Laterality: Right;  . WISDOM TOOTH EXTRACTION     3    Home Medications:  Prescriptions Prior to Admission  Medication Sig Dispense Refill Last Dose  . hydrochlorothiazide (HYDRODIURIL) 25 MG tablet Take 25 mg by mouth daily.   07/19/2016 at Unknown time  . ibuprofen (ADVIL,MOTRIN) 200 MG tablet Take 400 mg by mouth every 8 (eight) hours as needed (for pain/headache).     Marland Kitchen. levothyroxine (SYNTHROID, LEVOTHROID) 112 MCG tablet Take 112 mcg by mouth every evening.    07/18/2016 at Unknown time  . phentermine (ADIPEX-P) 37.5 MG tablet Take 37.5 mg by mouth daily before breakfast.   Past Month at Unknown time   Allergies: No Known Allergies  History reviewed. No pertinent family history. Social History:  reports that she has been smoking Cigarettes.  She has a 7.50 pack-year smoking history. She has never used smokeless tobacco. She reports that she drinks alcohol. She reports that she does not use drugs.  Review of Systems  Genitourinary: Positive for frequency and urgency.  All other systems reviewed and are negative.   Physical Exam:  Vital signs in last 24 hours: Temp:  [98.4 F (36.9 C)] 98.4 F (36.9 C) (05/07 0641) Pulse Rate:  [76] 76 (05/07 0641) Resp:  [15-18] 15 (05/07 0724) BP: (124)/(83) 124/83 (05/07 0641) SpO2:  [97 %-98 %] 97 % (05/07 0724) Physical Exam   Constitutional: She is oriented to person, place, and time. She appears well-developed and well-nourished.  HENT:  Head: Normocephalic and atraumatic.  Eyes: EOM are normal. Pupils are equal, round, and reactive to light.  Neck: Normal range of motion. No thyromegaly present.  Cardiovascular: Normal rate and regular rhythm.   Respiratory: Effort normal. No respiratory distress.  GI: Soft. She exhibits no distension.  Musculoskeletal: Normal range of motion. She exhibits no edema.  Neurological: She is alert and oriented to person, place, and time.  Skin: Skin is warm and dry.  Psychiatric: She has a normal mood and affect. Her behavior is normal. Judgment and thought content normal.    Laboratory Data:  No results found for this or any previous visit (from the past 24 hour(s)). No results found for this or any previous visit (from the past 240 hour(s)). Creatinine:  Recent Labs  07/13/16 0824  CREATININE 0.82   Baseline Creatinine: 0.8  Impression/Assessment:  35yo with R ureteral calculus  Plan:  The risks/benefits/alternaitves to R URS was explained to the patient and he understands and wishes to proceed with surgery  Wilkie AyePatrick Amarylis Rovito 07/19/2016, 7:30 AM

## 2016-07-20 ENCOUNTER — Encounter (HOSPITAL_COMMUNITY): Payer: Self-pay | Admitting: Urology

## 2016-07-27 LAB — STONE ANALYSIS
CA OXALATE, MONOHYDR.: 50 %
Ca Oxalate,Dihydrate: 15 %
Ca phos cry stone ql IR: 35 %
Stone Weight KSTONE: 223.2 mg

## 2016-08-03 ENCOUNTER — Other Ambulatory Visit (HOSPITAL_COMMUNITY)
Admission: RE | Admit: 2016-08-03 | Discharge: 2016-08-03 | Disposition: A | Discharge status: Home / Self Care | Payer: BLUE CROSS/BLUE SHIELD | Source: Other Acute Inpatient Hospital | Attending: Urology | Admitting: Urology

## 2016-08-03 ENCOUNTER — Ambulatory Visit (INDEPENDENT_AMBULATORY_CARE_PROVIDER_SITE_OTHER): Payer: BLUE CROSS/BLUE SHIELD | Admitting: Urology

## 2016-08-03 DIAGNOSIS — N201 Calculus of ureter: Secondary | ICD-10-CM

## 2016-08-05 LAB — URINE CULTURE

## 2016-09-29 ENCOUNTER — Ambulatory Visit (INDEPENDENT_AMBULATORY_CARE_PROVIDER_SITE_OTHER): Payer: BLUE CROSS/BLUE SHIELD | Admitting: Urology

## 2016-09-29 DIAGNOSIS — N200 Calculus of kidney: Secondary | ICD-10-CM

## 2017-03-04 ENCOUNTER — Other Ambulatory Visit: Payer: Self-pay | Admitting: Urology

## 2017-03-04 DIAGNOSIS — N200 Calculus of kidney: Secondary | ICD-10-CM

## 2017-03-17 ENCOUNTER — Ambulatory Visit (HOSPITAL_COMMUNITY): Admission: RE | Admit: 2017-03-17 | Payer: BLUE CROSS/BLUE SHIELD | Source: Ambulatory Visit

## 2017-03-27 IMAGING — CT CT ABD-PEL WO/W CM
2 of 8 series · 13 of 46 positions shown, 18 images · IV contrast (iopamidol)
Comparison: 02/27/2014

CLINICAL DATA: Hematuria.

EXAM:
CT ABDOMEN AND PELVIS WITHOUT AND WITH CONTRAST
TECHNIQUE: Multidetector CT imaging of the abdomen and pelvis was performed
following the standard protocol before and following the bolus
administration of intravenous contrast.
CONTRAST:  1 H2A7CO-2JJ IOPAMIDOL (H2A7CO-2JJ) INJECTION 61%

[Series 2: axial pre · axial · non-contrast · 0.98mm/px · z∈[-420,-35]mm · 10 of 93 slices shown, 15 images]
[im 8/93  soft-tissue]
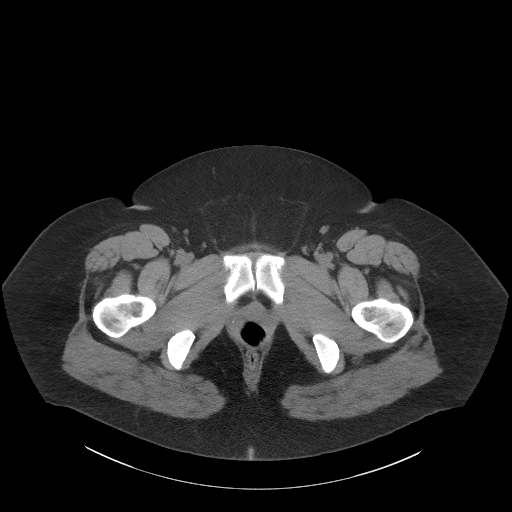
[im 8/93  bone]
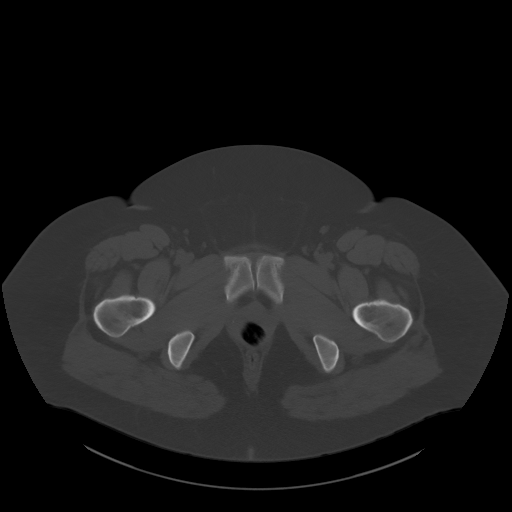
[im 16/93  soft-tissue]
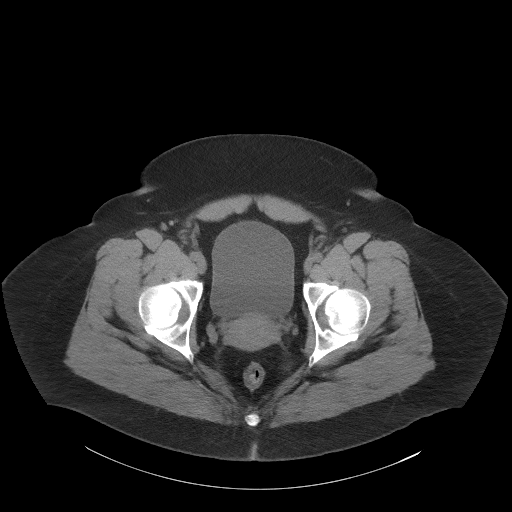
[im 31/93  soft-tissue]
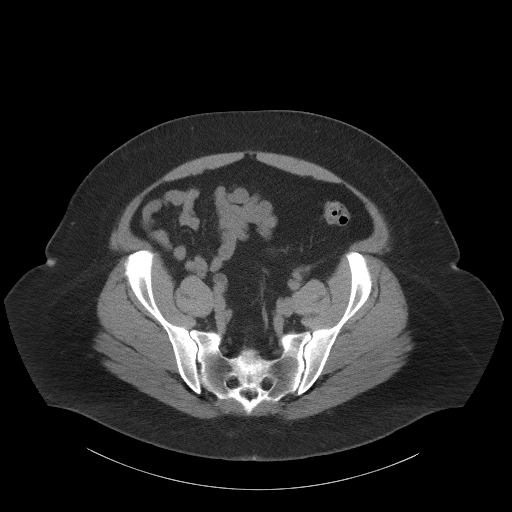
[im 39/93  soft-tissue]
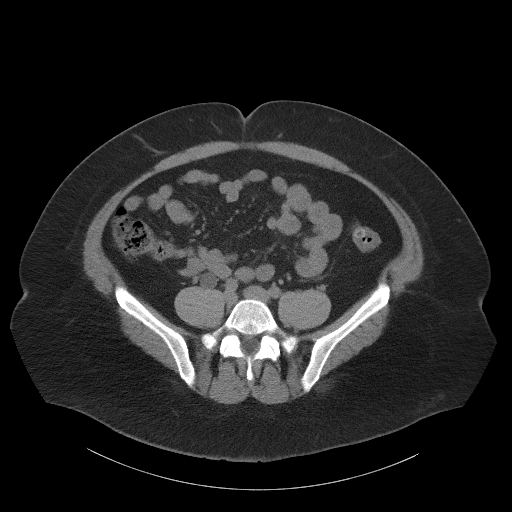
[im 47/93  soft-tissue]
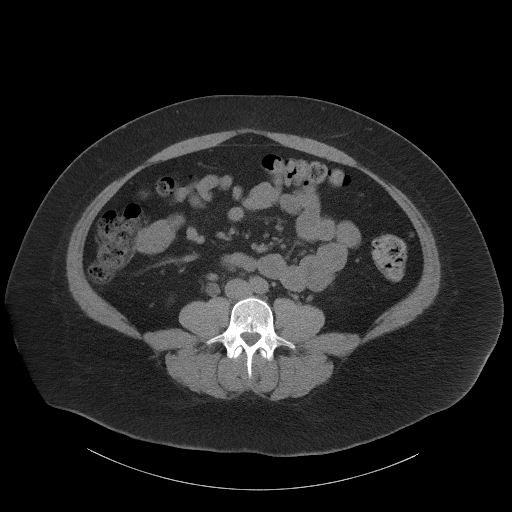
[im 54/93  soft-tissue]
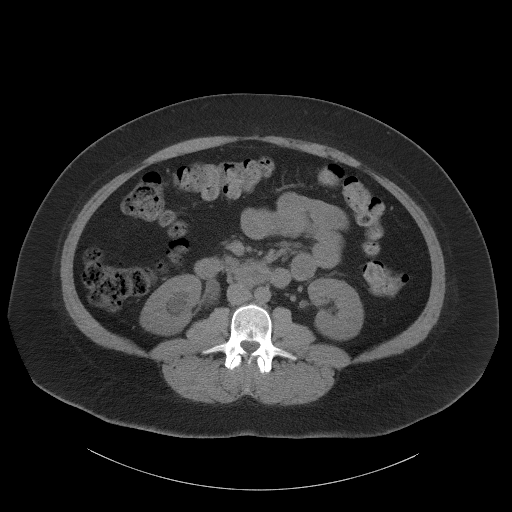
[im 62/93  soft-tissue]
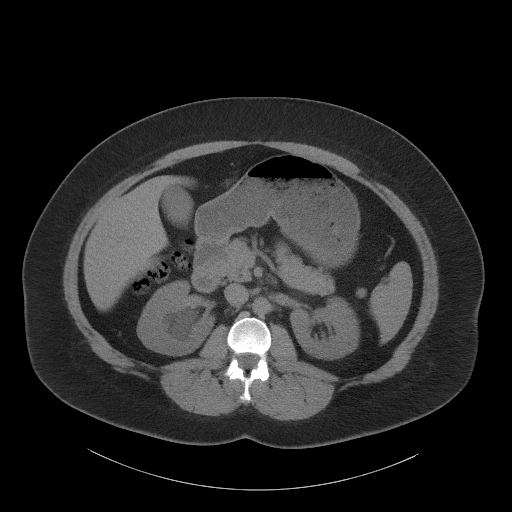
[im 62/93  lung]
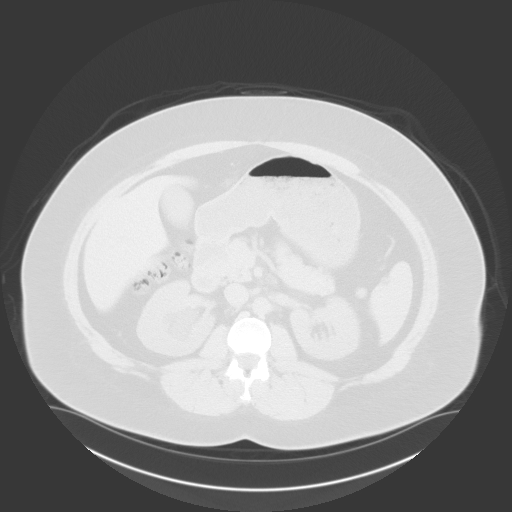
[im 70/93  lung]
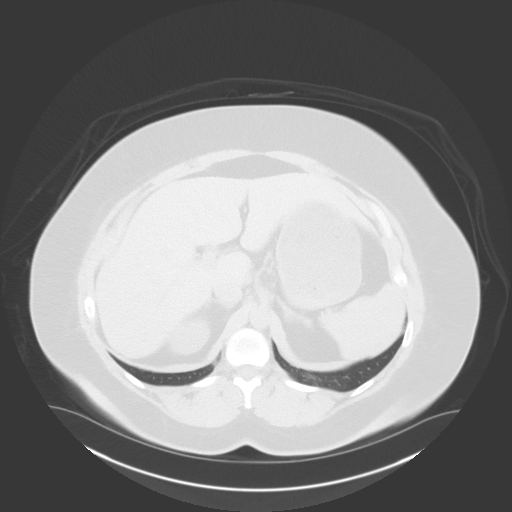
[im 77/93  soft-tissue]
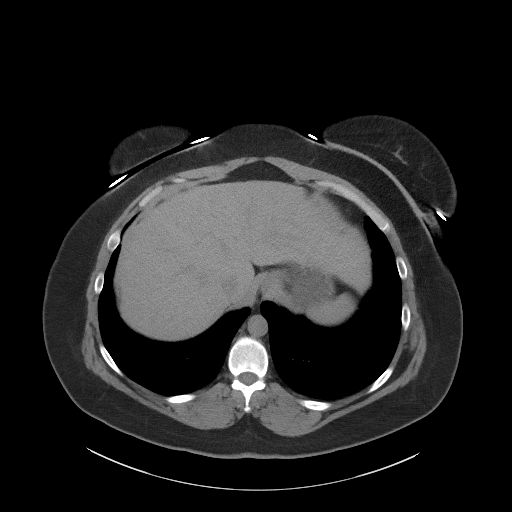
[im 77/93  lung]
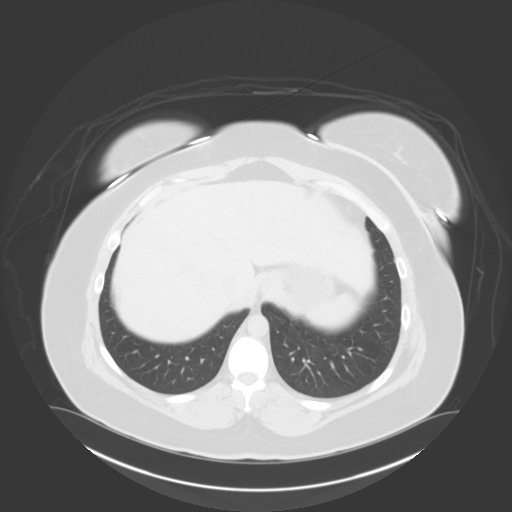
[im 85/93  soft-tissue]
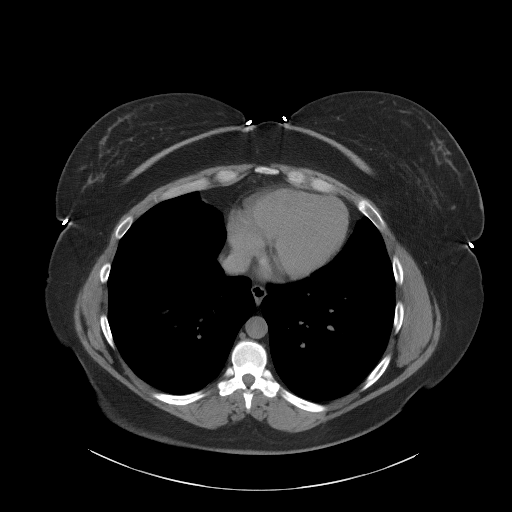
[im 85/93  lung]
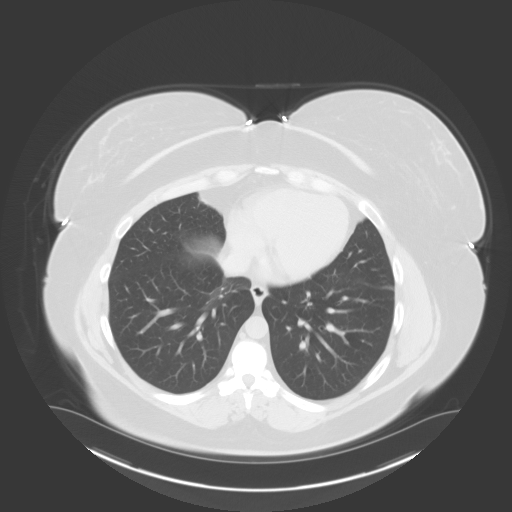
[im 85/93  bone]
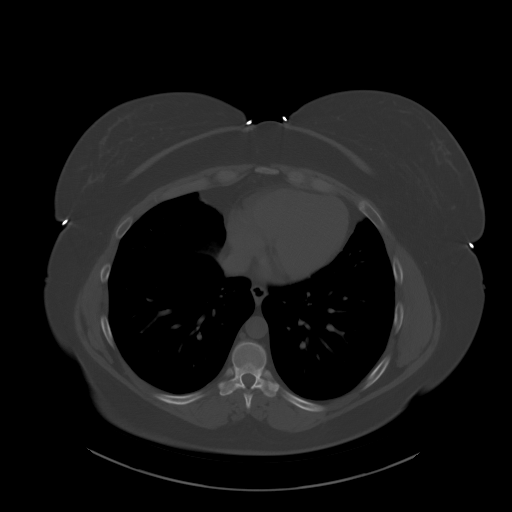

[Series 5: coronal pre · coronal · non-contrast · 0.88mm/px · 3 of 113 slices shown]
[im 23/113  soft-tissue]
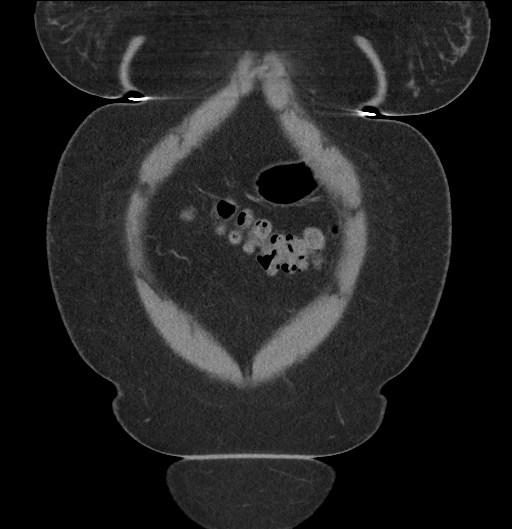
[im 45/113  soft-tissue]
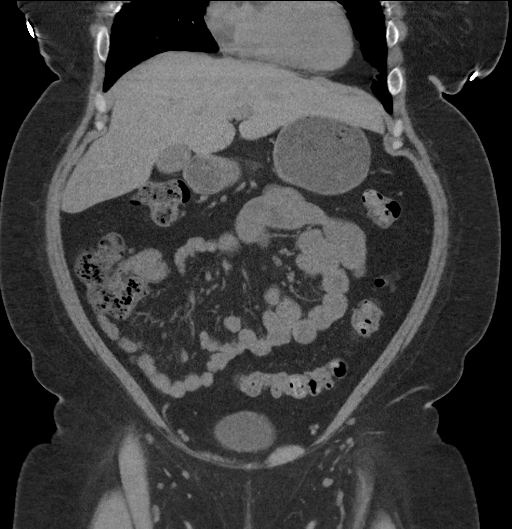
[im 68/113  soft-tissue]
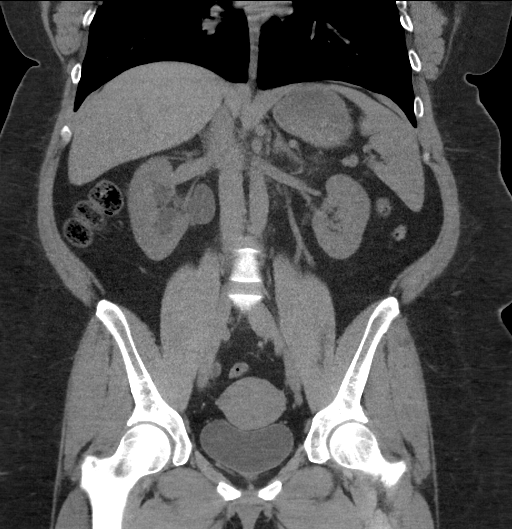

[13 of 46 positions shown; findings below may reference images not displayed]

FINDINGS: Lower chest:  Motion degraded without acute findings.

Hepatobiliary: No focal abnormality within the liver parenchyma.
There is no evidence for gallstones, gallbladder wall thickening, or
pericholecystic fluid. No intrahepatic or extrahepatic biliary
dilation.

Pancreas: No focal mass lesion. No dilatation of the main duct. No
intraparenchymal cyst. No peripancreatic edema.

Spleen: No splenomegaly. No focal mass lesion.

Adrenals/Urinary Tract: No adrenal nodule or mass. 1-2 mm
nonobstructing stone identified upper pole right kidney. There is
mild to moderate right hydroureteronephrosis down to the pelvis
where 16 x 8 x 15 mm ureteral stone is identified. No left renal or
ureteral stones. No bladder stones.

Imaging after IV contrast administration shows no enhancing lesion
in either kidney

Stomach/Bowel: Stomach is nondistended. No gastric wall thickening.
No evidence of outlet obstruction. Duodenum is normally positioned
as is the ligament of Treitz. No small bowel wall thickening. No
small bowel dilatation. The terminal ileum is normal. The appendix
is normal. No gross colonic mass. No colonic wall thickening. No
substantial diverticular change.

Vascular/Lymphatic: No abdominal aortic aneurysm. No abdominal
aortic atherosclerotic calcification. There is no gastrohepatic or
hepatoduodenal ligament lymphadenopathy. No intraperitoneal or
retroperitoneal lymphadenopathy. No pelvic sidewall lymphadenopathy.

Reproductive: The uterus has normal CT imaging appearance. There is
no adnexal mass.

Other: No intraperitoneal free fluid.

Musculoskeletal: Bone windows reveal no worrisome lytic or sclerotic
osseous lesions.
IMPRESSION: 1. 16 x 8 x 15 mm distal right ureteral stone about 5 cm proximal to
the UVJ causes mild to moderate right hydronephrosis.
2. Tiny nonobstructing right renal stone.

## 2017-04-23 IMAGING — DX DG ABDOMEN 1V
1 series · 1 of 1 positions shown · non-contrast
Comparison: 05/27/2016

CLINICAL DATA: Known renal and ureteral stones status post
lithotripsy

EXAM:
ABDOMEN - 1 VIEW

[abdomen kub]
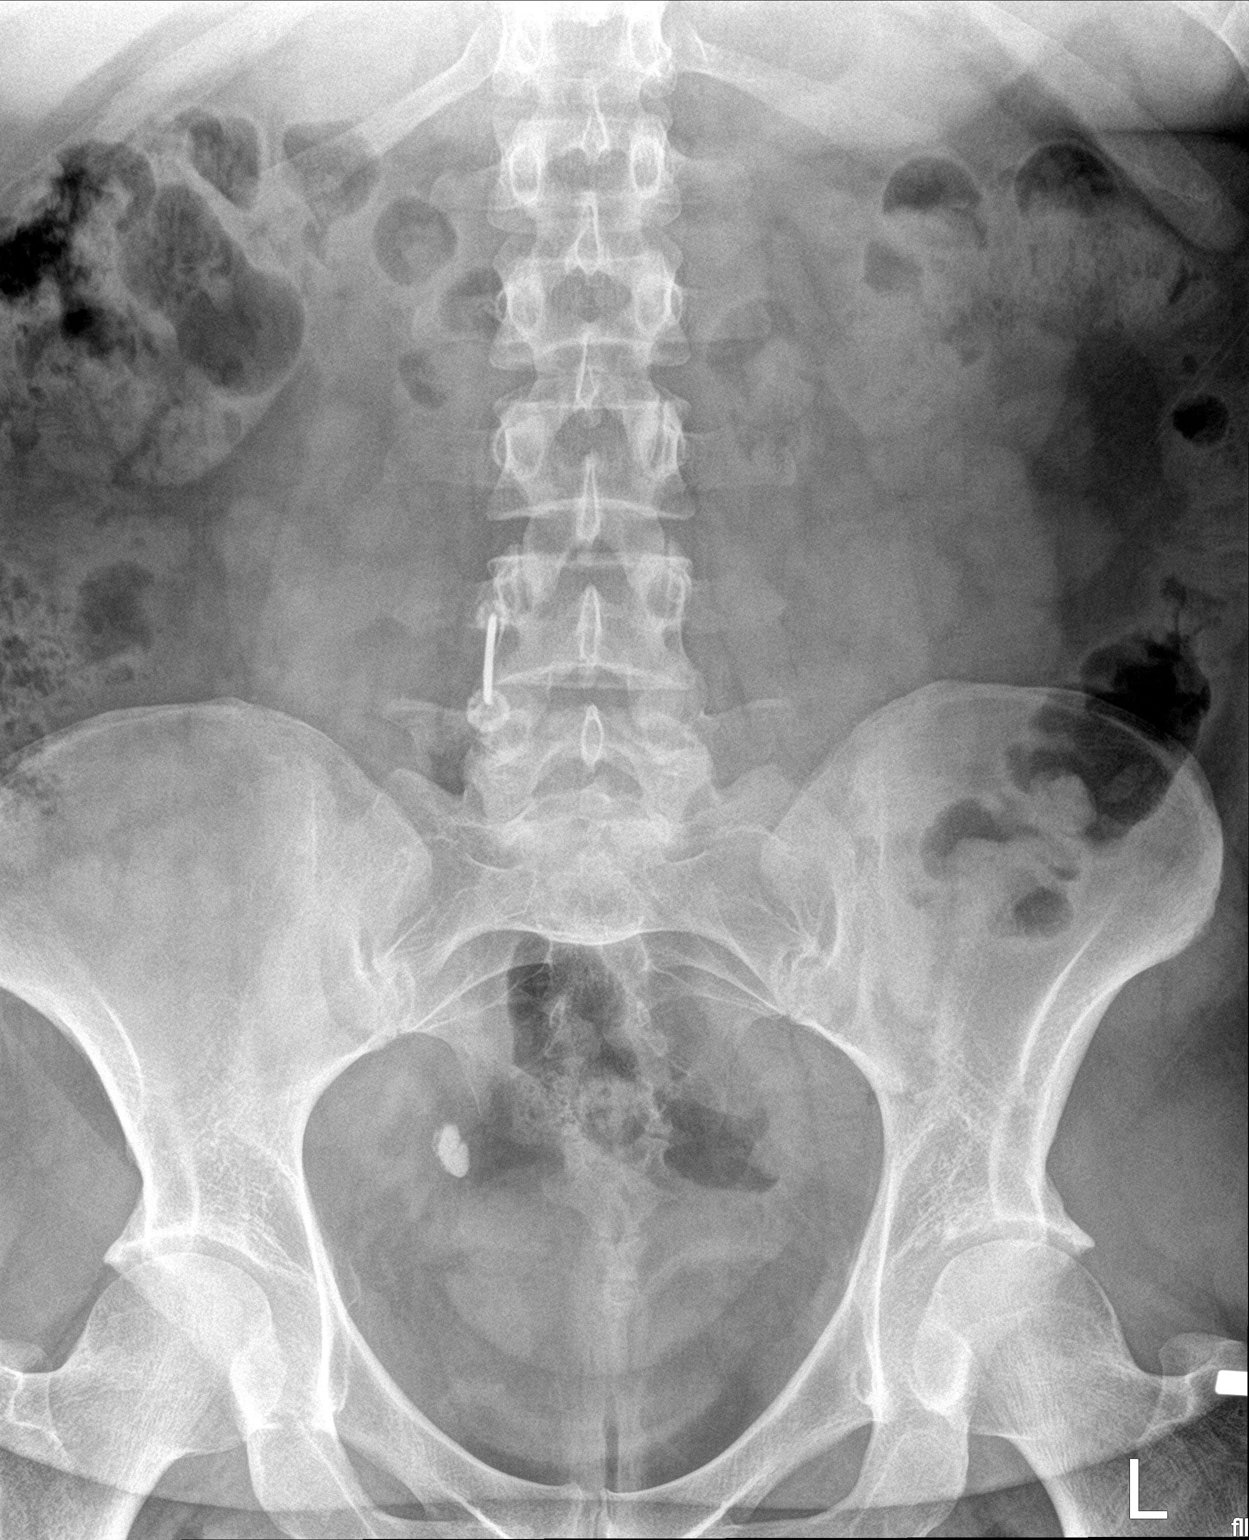

[1 of 1 positions shown; findings below may reference images not displayed]

FINDINGS: Scattered large and small bowel gas is seen. The small
nonobstructing right renal stone in the upper pole was not well
appreciated on this exam due to overlying bowel gas. The previously
seen distal right ureteral stone is again identified and likely
demonstrates some changes of fragmentation although the overall
stone burden is stable.
IMPRESSION: Apparent Fragmentation of right distal ureteral stone although the
overall stone burden is stable.

These results will be called to the ordering clinician or
representative by the Radiologist Assistant, and communication
documented in the PACS or zVision Dashboard.

## 2017-05-19 IMAGING — RF DG RETROGRADE PYELOGRAM
1 series · 4 of 4 positions shown · non-contrast
Comparison: None.

CLINICAL DATA: Ureteral calculus.

EXAM:
INTRAOPERATIVE RIGHT RETROGRADE UROGRAPHY
TECHNIQUE: Images were obtained with the C-arm fluoroscopic device
intraoperatively and submitted for interpretation post-operatively.
Please see the procedural report for the amount of contrast and the
fluoroscopy time utilized.

[Series 1: run · 4 of 4 slices shown]
[im 1/4]
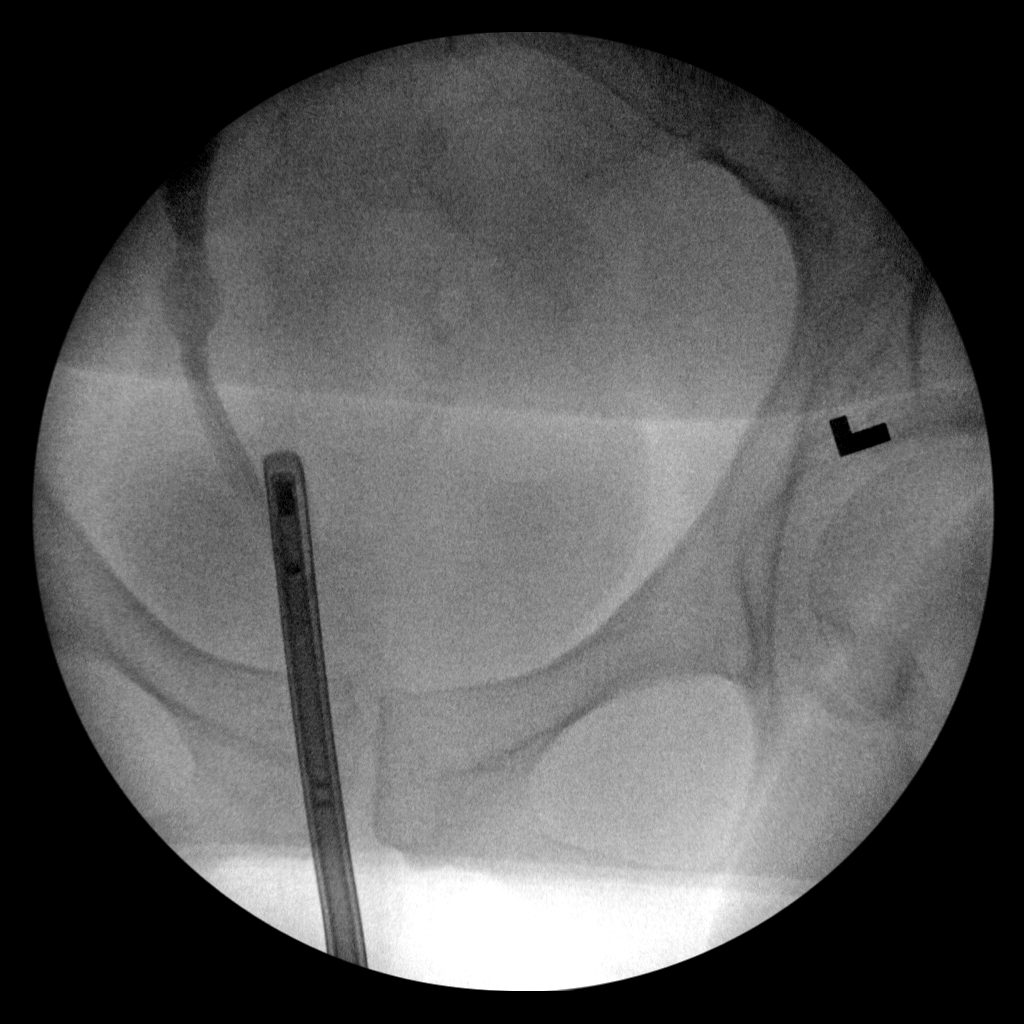
[im 2/4]
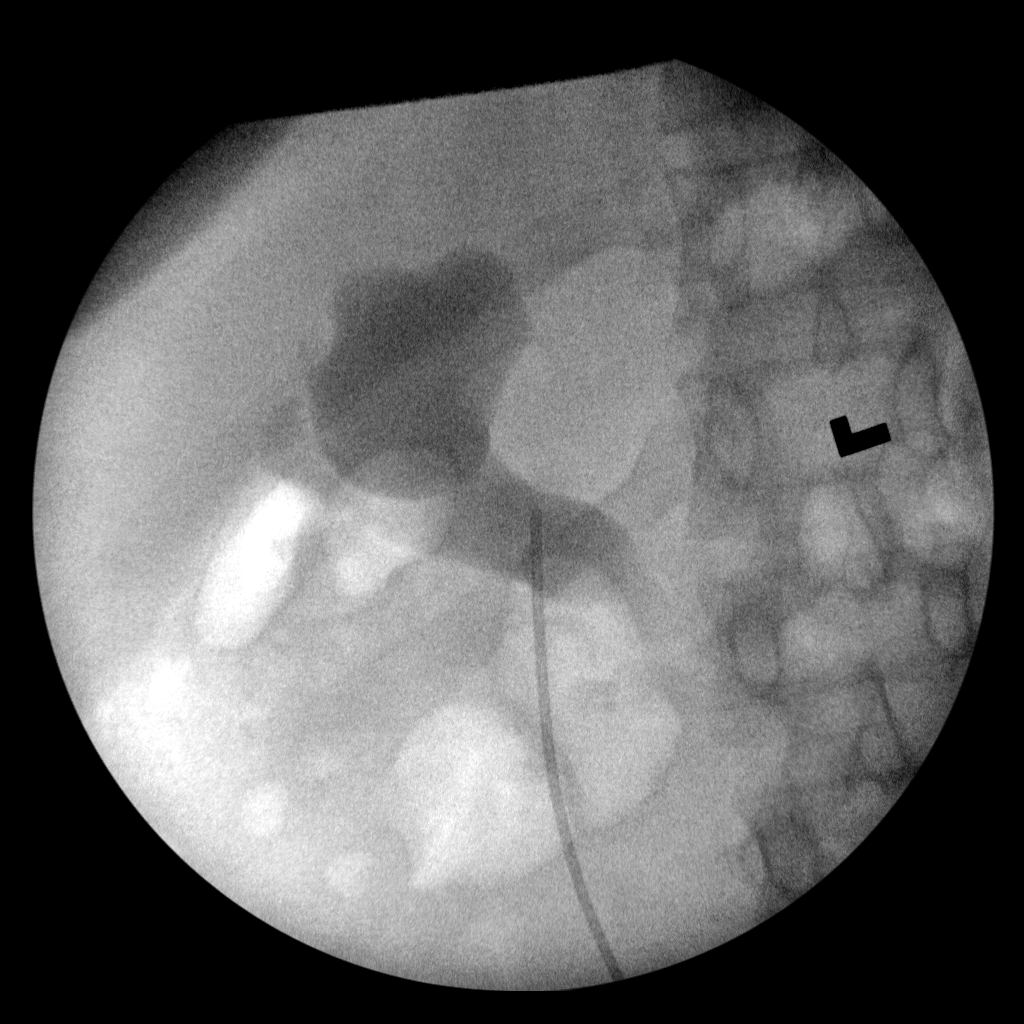
[im 3/4]
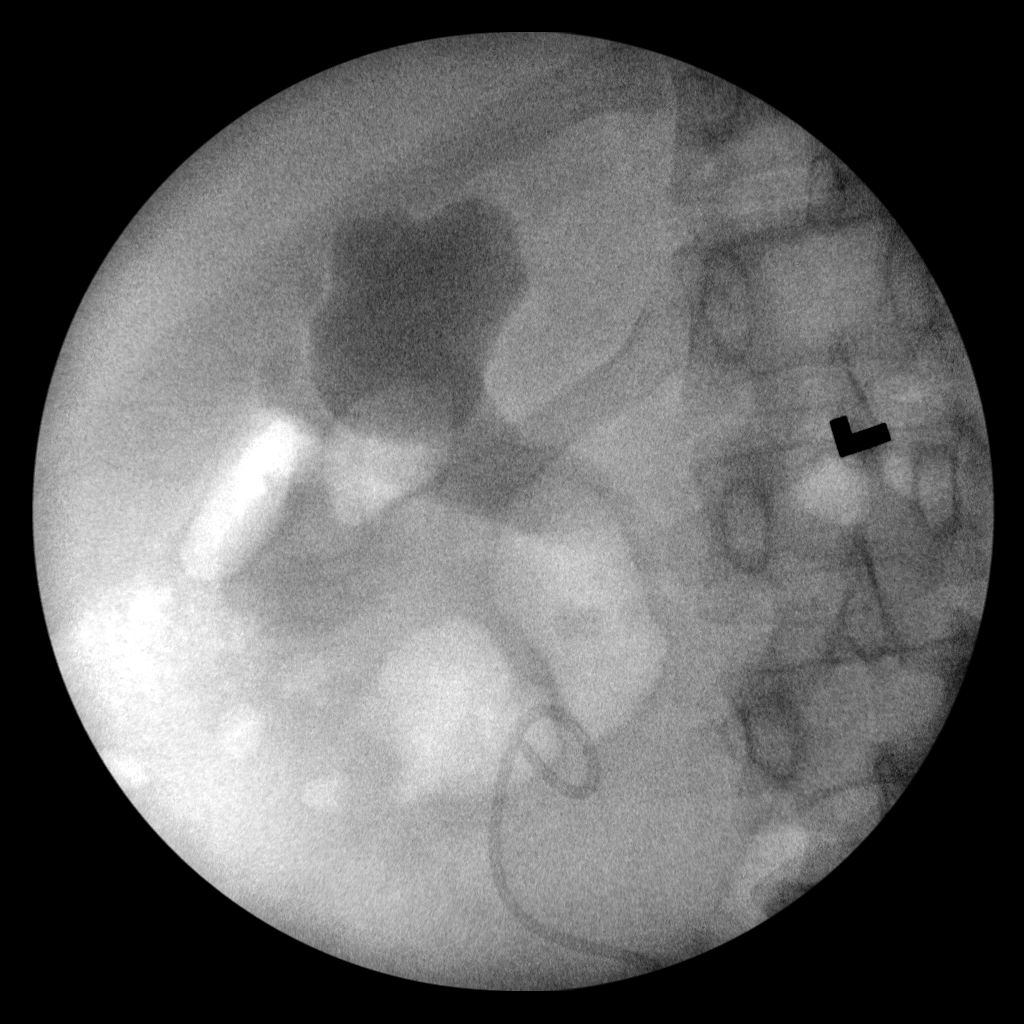
[im 4/4]
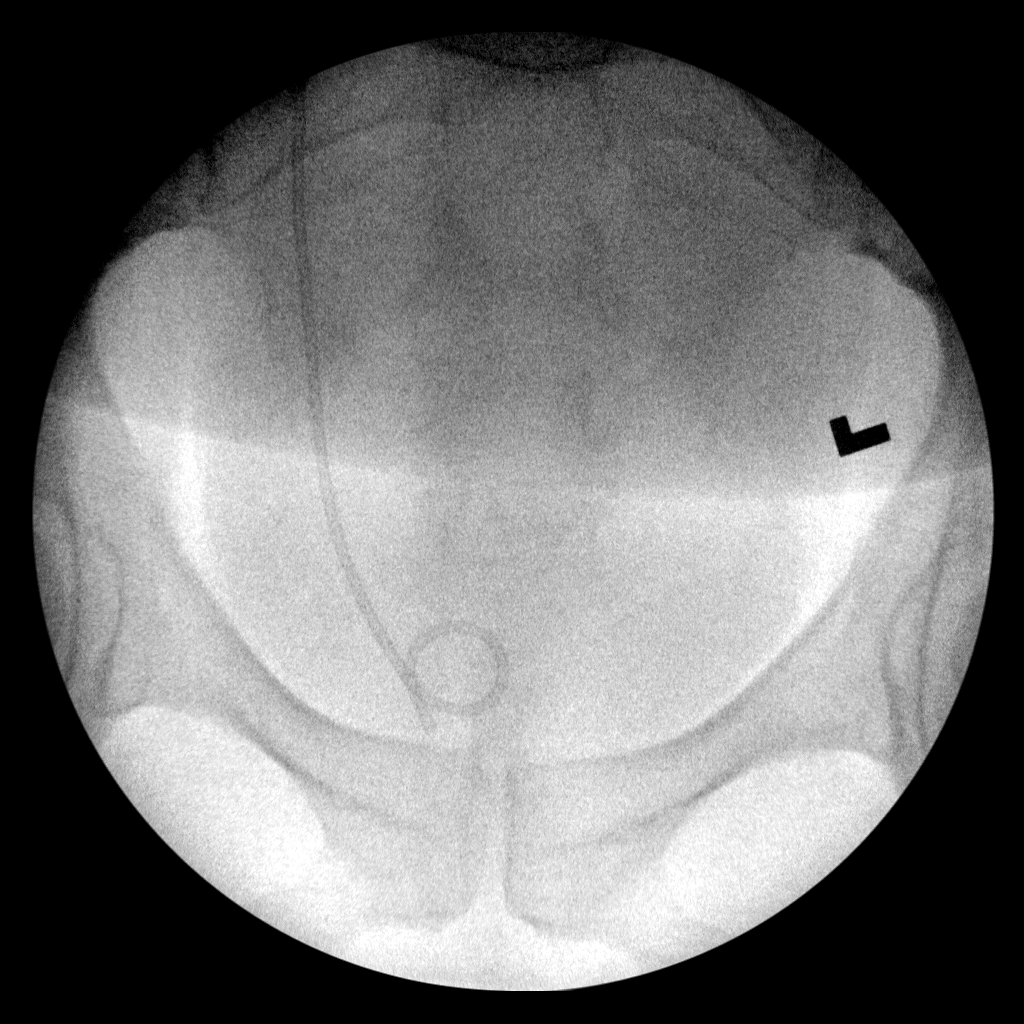

[4 of 4 positions shown; findings below may reference images not displayed]

FINDINGS: Images demonstrate cannulation of the right ureter orifice and
contrast filling the right ureter. Hydronephrosis of the right
kidney is present. A right ureteral stent has been placed. The
proximal and is coiled over the region of the proximal ureter. The
distal and is coiled over the pelvis and presumably within the
bladder
IMPRESSION: Right ureteral stent placement.

## 2023-07-18 LAB — TSH: TSH: 7.19 — AB (ref 0.41–5.90)

## 2023-08-10 ENCOUNTER — Other Ambulatory Visit: Payer: Self-pay | Admitting: Nurse Practitioner

## 2023-08-10 DIAGNOSIS — Z1231 Encounter for screening mammogram for malignant neoplasm of breast: Secondary | ICD-10-CM

## 2023-08-16 ENCOUNTER — Encounter

## 2023-09-22 NOTE — Progress Notes (Signed)
 Sent message, via epic in basket, requesting orders in epic from Careers adviser.

## 2023-09-27 ENCOUNTER — Ambulatory Visit: Payer: Self-pay | Admitting: Surgery

## 2023-09-27 NOTE — Progress Notes (Signed)
Second request for pre op orders in [facility]: Spoke with Joni Reining

## 2023-09-28 NOTE — Patient Instructions (Signed)
 SURGICAL WAITING ROOM VISITATION  Patients having surgery or a procedure may have no more than 2 support people in the waiting area - these visitors may rotate.    Children under the age of 71 must have an adult with them who is not the patient.  Visitors with respiratory illnesses are discouraged from visiting and should remain at home.  If the patient needs to stay at the hospital during part of their recovery, the visitor guidelines for inpatient rooms apply. Pre-op nurse will coordinate an appropriate time for 1 support person to accompany patient in pre-op.  This support person may not rotate.    Please refer to the [facility] website for the visitor guidelines for Inpatients (after your surgery is over and you are in a regular room).    Your procedure is scheduled on: 10/03/23   Report to [facility] Main Entrance    Report to admitting at 9:15 AM   Call this number if you have problems the morning of surgery [phone]   Do not eat food :After Midnight.   After Midnight you may have the following liquids until 8:30 AM/ PM DAY OF SURGERY  Water  Non-Citrus Juices (without pulp, NO RED-Apple, White grape, White cranberry) Black Coffee (NO MILK/CREAM OR CREAMERS, sugar ok)  Clear Tea (NO MILK/CREAM OR CREAMERS, sugar ok) regular and decaf                             Plain Jell-O (NO RED)                                           Fruit ices (not with fruit pulp, NO RED)                                     Popsicles (NO RED)                                                               Sports drinks like Gatorade (NO RED)                     If you have questions, please contact your surgeon's office.   FOLLOW BOWEL PREP AND ANY ADDITIONAL PRE OP INSTRUCTIONS YOU RECEIVED FROM YOUR SURGEON'S OFFICE!!!     Oral Hygiene is also important to reduce your risk of infection.                                    Remember - BRUSH YOUR TEETH THE MORNING OF SURGERY WITH YOUR  REGULAR TOOTHPASTE  DENTURES WILL BE REMOVED PRIOR TO SURGERY PLEASE DO NOT APPLY Poly grip OR ADHESIVES!!!   Do NOT smoke after Midnight   Stop all vitamins and herbal supplements 7 days before surgery.   Take these medicines the morning of surgery with A SIP OF WATER : Bupropion, Levothyroxine   DO NOT TAKE ANY ORAL DIABETIC MEDICATIONS DAY OF YOUR SURGERY  Bring CPAP mask and  tubing day of surgery.                              You may not have any metal on your body including hair pins, jewelry, and body piercing             Do not wear make-up, lotions, powders, perfumes, or deodorant  Do not wear nail polish including gel and S&S, artificial/acrylic nails, or any other type of covering on natural nails including finger and toenails. If you have artificial nails, gel coating, etc. that needs to be removed by a nail salon please have this removed prior to surgery or surgery may need to be canceled/ delayed if the surgeon/ anesthesia feels like they are unable to be safely monitored.   Do not shave  48 hours prior to surgery.    Do not bring valuables to the hospital. [facility] IS NOT             RESPONSIBLE   FOR VALUABLES.   Contacts, glasses, dentures or bridgework may not be worn into surgery.  DO NOT BRING YOUR HOME MEDICATIONS TO THE HOSPITAL. PHARMACY WILL DISPENSE MEDICATIONS LISTED ON YOUR MEDICATION LIST TO YOU DURING YOUR ADMISSION IN THE HOSPITAL!    Patients discharged on the day of surgery will not be allowed to drive home.  Someone NEEDS to stay with you for the first 24 hours after anesthesia.   Special Instructions: Bring a copy of your healthcare power of attorney and living will documents the day of surgery if you haven't scanned them before.              Please read over the following fact sheets you were given: IF YOU HAVE QUESTIONS ABOUT YOUR PRE-OP INSTRUCTIONS PLEASE CALL [phone] [place]   If you received a COVID test during your pre-op visit  it  is requested that you wear a mask when out in public, stay away from anyone that may not be feeling well and notify your surgeon if you develop symptoms. If you test positive for Covid or have been in contact with anyone that has tested positive in the last 10 days please notify you surgeon.    [place] - Preparing for Surgery Before surgery, you can play an important role.  Because skin is not sterile, your skin needs to be as free of germs as possible.  You can reduce the number of germs on your skin by washing with CHG (chlorahexidine gluconate) soap before surgery.  CHG is an antiseptic cleaner which kills germs and bonds with the skin to continue killing germs even after washing. Please DO NOT use if you have an allergy to CHG or antibacterial soaps.  If your skin becomes reddened/irritated stop using the CHG and inform your nurse when you arrive at Short Stay. Do not shave (including legs and underarms) for at least 48 hours prior to the first CHG shower.  You may shave your face/neck.  Please follow these instructions carefully:  1.  Shower with CHG Soap the night before surgery and the  morning of surgery.  2.  If you choose to wash your hair, wash your hair first as usual with your normal  shampoo.  3.  After you shampoo, rinse your hair and body thoroughly to remove the shampoo.  4.  Use CHG as you would any other liquid soap.  You can apply chg directly to the skin and wash.  Gently with a scrungie or clean washcloth.  5.  Apply the CHG Soap to your body ONLY FROM THE NECK DOWN.   Do   not use on face/ open                           Wound or open sores. Avoid contact with eyes, ears mouth and   genitals (private parts).                       Wash face,  Genitals (private parts) with your normal soap.             6.  Wash thoroughly, paying special attention to the area where your    surgery  will be performed.  7.  Thoroughly rinse your body with warm water   from the neck down.  8.  DO NOT shower/wash with your normal soap after using and rinsing off the CHG Soap.                9.  Pat yourself dry with a clean towel.            10.  Wear clean pajamas.            11.  Place clean sheets on your bed the night of your first shower and do not  sleep with pets. Day of Surgery : Do not apply any lotions/deodorants the morning of surgery.  Please wear clean clothes to the hospital/surgery center.  FAILURE TO FOLLOW THESE INSTRUCTIONS MAY RESULT IN THE CANCELLATION OF YOUR SURGERY  PATIENT SIGNATURE_________________________________  NURSE SIGNATURE__________________________________  ________________________________________________________________________

## 2023-09-28 NOTE — Progress Notes (Signed)
 COVID Vaccine Completed:  Date of COVID positive in last 90 days:  PCP - Keavie Hairfield, PA Cardiologist - n/a  Chest x-ray - n/a EKG - 09/29/23 Epic/chart Stress Test - n/a ECHO - n/a Cardiac Cath - n/a Pacemaker/ICD device last checked: n/a Spinal Cord Stimulator: n/a  Bowel Prep - no  Sleep Study - n/a CPAP -   Fasting Blood Sugar - n/a Checks Blood Sugar _____ times a day  Last dose of GLP1 agonist-  N/A GLP1 instructions:  Do not take after     Last dose of SGLT-2 inhibitors-  N/A SGLT-2 instructions:  Do not take after     Blood Thinner Instructions:  Last dose: n/a  Time: Aspirin Instructions: Last Dose:  Activity level: Can go up a flight of stairs and perform activities of daily living without stopping and without symptoms of chest pain or shortness of breath.   Anesthesia review: STOPBANG5, BP 170/120 and 159/117. Denies any symptoms, states she took her BP meds today. Instructed to check at home tonight and tomorrow morning. If still elevated she needs to reach out tot her PCP.  Patient denies shortness of breath, fever, cough and chest pain at PAT appointment  Patient verbalized understanding of instructions that were given to them at the PAT appointment. Patient was also instructed that they will need to review over the PAT instructions again at home before surgery.

## 2023-09-29 ENCOUNTER — Other Ambulatory Visit: Payer: Self-pay

## 2023-09-29 ENCOUNTER — Encounter (HOSPITAL_COMMUNITY)
Admission: RE | Admit: 2023-09-29 | Discharge: 2023-09-29 | Disposition: A | Discharge status: Home / Self Care | Source: Ambulatory Visit | Attending: Surgery | Admitting: Surgery

## 2023-09-29 ENCOUNTER — Encounter (HOSPITAL_COMMUNITY): Payer: Self-pay

## 2023-09-29 VITALS — BP 159/117 | HR 85 | Temp 98.0°F | Resp 18 | Ht 64.0 in | Wt 391.0 lb

## 2023-09-29 DIAGNOSIS — I10 Essential (primary) hypertension: Secondary | ICD-10-CM | POA: Diagnosis not present

## 2023-09-29 DIAGNOSIS — Z01812 Encounter for preprocedural laboratory examination: Secondary | ICD-10-CM | POA: Diagnosis present

## 2023-09-29 DIAGNOSIS — Z01818 Encounter for other preprocedural examination: Secondary | ICD-10-CM | POA: Insufficient documentation

## 2023-09-29 DIAGNOSIS — Z01810 Encounter for preprocedural cardiovascular examination: Secondary | ICD-10-CM | POA: Diagnosis present

## 2023-09-29 HISTORY — DX: Depression, unspecified: F32.A

## 2023-09-29 HISTORY — DX: Anxiety disorder, unspecified: F41.9

## 2023-09-29 HISTORY — DX: Gastro-esophageal reflux disease without esophagitis: K21.9

## 2023-09-29 HISTORY — DX: Other complications of anesthesia, initial encounter: T88.59XA

## 2023-09-29 LAB — BASIC METABOLIC PANEL WITH GFR
Anion gap: 12 (ref 5–15)
BUN: 13 mg/dL (ref 6–20)
CO2: 26 mmol/L (ref 22–32)
Calcium: 9.4 mg/dL (ref 8.9–10.3)
Chloride: 101 mmol/L (ref 98–111)
Creatinine, Ser: 0.92 mg/dL (ref 0.44–1.00)
GFR, Estimated: >60 mL/min (ref >60)
Glucose, Bld: 103 mg/dL — ABNORMAL HIGH (ref 70–99)
Potassium: 3 mmol/L — ABNORMAL LOW (ref 3.5–5.1)
Sodium: 139 mmol/L (ref 135–145)

## 2023-09-29 LAB — CBC
HCT: 46.2 % — ABNORMAL HIGH (ref 36.0–46.0)
Hemoglobin: 15.7 g/dL — ABNORMAL HIGH (ref 12.0–15.0)
MCH: 31.8 pg (ref 26.0–34.0)
MCHC: 34 g/dL (ref 30.0–36.0)
MCV: 93.5 fL (ref 80.0–100.0)
Platelets: 397 K/uL (ref 150–400)
RBC: 4.94 MIL/uL (ref 3.87–5.11)
RDW: 13.1 % (ref 11.5–15.5)
WBC: 12.2 K/uL — ABNORMAL HIGH (ref 4.0–10.5)
nRBC: 0 % (ref 0.0–0.2)

## 2023-09-29 NOTE — Progress Notes (Signed)
   09/29/23 1417  OBSTRUCTIVE SLEEP APNEA  Have you ever been diagnosed with sleep apnea through a sleep study? No  Do you snore loudly (loud enough to be heard through closed doors)?  1  Do you often feel tired, fatigued, or sleepy during the daytime (such as falling asleep during driving or talking to someone)? 1  Has anyone observed you stop breathing during your sleep? 1  Do you have, or are you being treated for high blood pressure? 1  BMI more than 35 kg/m2? 1  Age > 50 (1-yes) 0  Neck circumference greater than:Female 16 inches or larger, Female 17inches or larger? 0  Female Gender (Yes=1) 0  Obstructive Sleep Apnea Score 5

## 2023-09-30 ENCOUNTER — Encounter: Payer: Self-pay | Admitting: Advanced Practice Midwife

## 2023-09-30 NOTE — Anesthesia Preprocedure Evaluation (Addendum)
 Anesthesia Evaluation  Patient identified by MRN, date of birth, ID band Patient awake    Reviewed: Allergy & Precautions, NPO status , Patient's Chart, lab work & pertinent test results  Airway Mallampati: II  TM Distance: >3 FB Neck ROM: Full    Dental  (+) Teeth Intact, Dental Advisory Given   Pulmonary Current SmokerPatient did not abstain from smoking.   Pulmonary exam normal breath sounds clear to auscultation       Cardiovascular hypertension, Pt. on medications Normal cardiovascular exam Rhythm:Regular Rate:Normal     Neuro/Psych  PSYCHIATRIC DISORDERS Anxiety Depression    ICH    GI/Hepatic ,GERD  ,,Calculus of gallbladder with acute and chronic cholecystitis   Endo/Other  Hypothyroidism  Class V obesity (BMI 67)  Renal/GU negative Renal ROS     Musculoskeletal negative musculoskeletal ROS (+)    Abdominal   Peds  Hematology negative hematology ROS (+)   Anesthesia Other Findings Day of surgery medications reviewed with the patient.  Reproductive/Obstetrics                              Anesthesia Physical Anesthesia Plan  ASA: 4  Anesthesia Plan: General   Post-op Pain Management: Tylenol  PO (pre-op)* and Gabapentin  PO (pre-op)*   Induction: Intravenous  PONV Risk Score and Plan: 3 and Midazolam , Dexamethasone  and Ondansetron   Airway Management Planned: Oral ETT  Additional Equipment: ClearSight  Intra-op Plan:   Post-operative Plan: Extubation in OR  Informed Consent: I have reviewed the patients History and Physical, chart, labs and discussed the procedure including the risks, benefits and alternatives for the proposed anesthesia with the patient or authorized representative who has indicated his/her understanding and acceptance.     Dental advisory given  Plan Discussed with: CRNA  Anesthesia Plan Comments: (Reviewed. PMH of HTN, GERD, hypothyroid, anxiety,  depression, obesity (BMI 67). Labs show mild hypokalemia (3.0), mild leukocytosis (WBC 12). EKG NSR. BP elevated at PAT visit. Pt advised to monitor and contact PCP if remains elevated.    )         Anesthesia Quick Evaluation

## 2023-10-03 ENCOUNTER — Other Ambulatory Visit: Payer: Self-pay

## 2023-10-03 ENCOUNTER — Ambulatory Visit (HOSPITAL_COMMUNITY)
Admission: RE | Admit: 2023-10-03 | Discharge: 2023-10-03 | Disposition: A | Discharge status: Home / Self Care | Source: Ambulatory Visit | Attending: Surgery | Admitting: Surgery

## 2023-10-03 ENCOUNTER — Encounter (HOSPITAL_COMMUNITY): Payer: Self-pay | Admitting: Surgery

## 2023-10-03 ENCOUNTER — Encounter (HOSPITAL_COMMUNITY)
Admission: RE | Disposition: A | Discharge status: Home / Self Care | Payer: Self-pay | Source: Ambulatory Visit | Attending: Surgery

## 2023-10-03 ENCOUNTER — Ambulatory Visit (HOSPITAL_BASED_OUTPATIENT_CLINIC_OR_DEPARTMENT_OTHER): Payer: Self-pay | Admitting: Anesthesiology

## 2023-10-03 ENCOUNTER — Ambulatory Visit (HOSPITAL_COMMUNITY): Payer: Self-pay | Admitting: Medical

## 2023-10-03 DIAGNOSIS — F32A Depression, unspecified: Secondary | ICD-10-CM | POA: Diagnosis not present

## 2023-10-03 DIAGNOSIS — E039 Hypothyroidism, unspecified: Secondary | ICD-10-CM

## 2023-10-03 DIAGNOSIS — Z79899 Other long term (current) drug therapy: Secondary | ICD-10-CM | POA: Diagnosis not present

## 2023-10-03 DIAGNOSIS — F17210 Nicotine dependence, cigarettes, uncomplicated: Secondary | ICD-10-CM | POA: Diagnosis not present

## 2023-10-03 DIAGNOSIS — F419 Anxiety disorder, unspecified: Secondary | ICD-10-CM | POA: Diagnosis not present

## 2023-10-03 DIAGNOSIS — Z79890 Hormone replacement therapy (postmenopausal): Secondary | ICD-10-CM | POA: Diagnosis not present

## 2023-10-03 DIAGNOSIS — K8012 Calculus of gallbladder with acute and chronic cholecystitis without obstruction: Secondary | ICD-10-CM | POA: Diagnosis present

## 2023-10-03 DIAGNOSIS — I10 Essential (primary) hypertension: Secondary | ICD-10-CM

## 2023-10-03 DIAGNOSIS — E6689 Other obesity not elsewhere classified: Secondary | ICD-10-CM | POA: Diagnosis not present

## 2023-10-03 DIAGNOSIS — K219 Gastro-esophageal reflux disease without esophagitis: Secondary | ICD-10-CM | POA: Insufficient documentation

## 2023-10-03 DIAGNOSIS — Z6841 Body Mass Index (BMI) 40.0 and over, adult: Secondary | ICD-10-CM | POA: Insufficient documentation

## 2023-10-03 DIAGNOSIS — F418 Other specified anxiety disorders: Secondary | ICD-10-CM

## 2023-10-03 DIAGNOSIS — K8010 Calculus of gallbladder with chronic cholecystitis without obstruction: Secondary | ICD-10-CM | POA: Diagnosis not present

## 2023-10-03 HISTORY — PX: CHOLECYSTECTOMY: SHX55

## 2023-10-03 LAB — POCT PREGNANCY, URINE: Preg Test, Ur: NEGATIVE

## 2023-10-03 SURGERY — LAPAROSCOPIC CHOLECYSTECTOMY
Anesthesia: General

## 2023-10-03 MED ORDER — MIDAZOLAM HCL 2 MG/2ML IJ SOLN
INTRAMUSCULAR | Status: AC
  Filled 2023-10-03: qty 2

## 2023-10-03 MED ORDER — ACETAMINOPHEN 500 MG PO TABS
1000.0000 mg | ORAL_TABLET | ORAL | Status: AC
  Administered 2023-10-03: 1000 mg via ORAL
  Filled 2023-10-03: qty 2

## 2023-10-03 MED ORDER — FENTANYL CITRATE (PF) 100 MCG/2ML IJ SOLN
INTRAMUSCULAR | Status: DC | PRN
  Administered 2023-10-03 (×2): 50 ug via INTRAVENOUS
  Administered 2023-10-03: 100 ug via INTRAVENOUS
  Administered 2023-10-03: 50 ug via INTRAVENOUS

## 2023-10-03 MED ORDER — LIDOCAINE HCL (PF) 2 % IJ SOLN
INTRAMUSCULAR | Status: AC
  Filled 2023-10-03: qty 5

## 2023-10-03 MED ORDER — STERILE WATER FOR IRRIGATION IR SOLN
Status: DC | PRN
  Administered 2023-10-03: 1000 mL

## 2023-10-03 MED ORDER — ONDANSETRON HCL 4 MG/2ML IJ SOLN
INTRAMUSCULAR | Status: DC | PRN
  Administered 2023-10-03: 4 mg via INTRAVENOUS

## 2023-10-03 MED ORDER — ROCURONIUM BROMIDE 10 MG/ML (PF) SYRINGE
PREFILLED_SYRINGE | INTRAVENOUS | Status: AC
  Filled 2023-10-03: qty 10

## 2023-10-03 MED ORDER — ONDANSETRON HCL 4 MG/2ML IJ SOLN: 4.0000 mg | Freq: Once | INTRAMUSCULAR | Status: DC | PRN

## 2023-10-03 MED ORDER — PROPOFOL 10 MG/ML IV BOLUS
INTRAVENOUS | Status: AC
  Filled 2023-10-03: qty 20

## 2023-10-03 MED ORDER — AMISULPRIDE (ANTIEMETIC) 5 MG/2ML IV SOLN: 10.0000 mg | Freq: Once | INTRAVENOUS | Status: DC | PRN

## 2023-10-03 MED ORDER — MIDAZOLAM HCL 5 MG/5ML IJ SOLN
INTRAMUSCULAR | Status: DC | PRN
  Administered 2023-10-03: 2 mg via INTRAVENOUS

## 2023-10-03 MED ORDER — FENTANYL CITRATE PF 50 MCG/ML IJ SOSY: 25.0000 ug | PREFILLED_SYRINGE | INTRAMUSCULAR | Status: DC | PRN

## 2023-10-03 MED ORDER — SUCCINYLCHOLINE CHLORIDE 200 MG/10ML IV SOSY
PREFILLED_SYRINGE | INTRAVENOUS | Status: AC
  Filled 2023-10-03: qty 10

## 2023-10-03 MED ORDER — HEPARIN SODIUM (PORCINE) 5000 UNIT/ML IJ SOLN
5000.0000 [IU] | Freq: Once | INTRAMUSCULAR | Status: AC
  Administered 2023-10-03: 5000 [IU] via SUBCUTANEOUS
  Filled 2023-10-03: qty 1

## 2023-10-03 MED ORDER — CHLORHEXIDINE GLUCONATE CLOTH 2 % EX PADS: 6.0000 | MEDICATED_PAD | Freq: Once | CUTANEOUS | Status: DC

## 2023-10-03 MED ORDER — CEFAZOLIN SODIUM-DEXTROSE 3-4 GM/150ML-% IV SOLN
3.0000 g | INTRAVENOUS | Status: AC
  Administered 2023-10-03: 3 g via INTRAVENOUS
  Filled 2023-10-03: qty 150

## 2023-10-03 MED ORDER — SUGAMMADEX SODIUM 200 MG/2ML IV SOLN
INTRAVENOUS | Status: DC | PRN
  Administered 2023-10-03: 400 mg via INTRAVENOUS

## 2023-10-03 MED ORDER — PROPOFOL 10 MG/ML IV BOLUS
INTRAVENOUS | Status: DC | PRN
  Administered 2023-10-03: 200 mg via INTRAVENOUS

## 2023-10-03 MED ORDER — GABAPENTIN 300 MG PO CAPS
300.0000 mg | ORAL_CAPSULE | ORAL | Status: AC
  Administered 2023-10-03: 300 mg via ORAL
  Filled 2023-10-03: qty 1

## 2023-10-03 MED ORDER — LABETALOL HCL 5 MG/ML IV SOLN
INTRAVENOUS | Status: AC
  Filled 2023-10-03: qty 4

## 2023-10-03 MED ORDER — ORAL CARE MOUTH RINSE: 15.0000 mL | Freq: Once | OROMUCOSAL | Status: AC

## 2023-10-03 MED ORDER — LIDOCAINE HCL (CARDIAC) PF 100 MG/5ML IV SOSY
PREFILLED_SYRINGE | INTRAVENOUS | Status: DC | PRN
  Administered 2023-10-03: 100 mg via INTRATRACHEAL

## 2023-10-03 MED ORDER — PHENYLEPHRINE 80 MCG/ML (10ML) SYRINGE FOR IV PUSH (FOR BLOOD PRESSURE SUPPORT)
PREFILLED_SYRINGE | INTRAVENOUS | Status: DC | PRN
  Administered 2023-10-03: 160 ug via INTRAVENOUS

## 2023-10-03 MED ORDER — ROCURONIUM BROMIDE 10 MG/ML (PF) SYRINGE
PREFILLED_SYRINGE | INTRAVENOUS | Status: DC | PRN
  Administered 2023-10-03: 100 mg via INTRAVENOUS

## 2023-10-03 MED ORDER — LABETALOL HCL 5 MG/ML IV SOLN
10.0000 mg | Freq: Once | INTRAVENOUS | Status: AC
  Administered 2023-10-03: 10 mg via INTRAVENOUS

## 2023-10-03 MED ORDER — FENTANYL CITRATE (PF) 250 MCG/5ML IJ SOLN
INTRAMUSCULAR | Status: AC
  Filled 2023-10-03: qty 5

## 2023-10-03 MED ORDER — DEXAMETHASONE SODIUM PHOSPHATE 4 MG/ML IJ SOLN
INTRAMUSCULAR | Status: DC | PRN
  Administered 2023-10-03: 10 mg via INTRAVENOUS

## 2023-10-03 MED ORDER — BUPIVACAINE-EPINEPHRINE (PF) 0.25% -1:200000 IJ SOLN
INTRAMUSCULAR | Status: AC
  Filled 2023-10-03: qty 30

## 2023-10-03 MED ORDER — SUCCINYLCHOLINE CHLORIDE 200 MG/10ML IV SOSY
PREFILLED_SYRINGE | INTRAVENOUS | Status: DC | PRN
  Administered 2023-10-03: 200 mg via INTRAVENOUS

## 2023-10-03 MED ORDER — LACTATED RINGERS IV SOLN: INTRAVENOUS | Status: DC

## 2023-10-03 MED ORDER — HYDROMORPHONE HCL 2 MG/ML IJ SOLN
INTRAMUSCULAR | Status: AC
  Filled 2023-10-03: qty 1

## 2023-10-03 MED ORDER — HEMOSTATIC AGENTS (NO CHARGE) OPTIME
TOPICAL | Status: DC | PRN
  Administered 2023-10-03: 1

## 2023-10-03 MED ORDER — HYDROMORPHONE HCL 1 MG/ML IJ SOLN
INTRAMUSCULAR | Status: DC | PRN
  Administered 2023-10-03 (×2): 1 mg via INTRAVENOUS

## 2023-10-03 MED ORDER — CHLORHEXIDINE GLUCONATE 0.12 % MT SOLN
15.0000 mL | Freq: Once | OROMUCOSAL | Status: AC
  Administered 2023-10-03: 15 mL via OROMUCOSAL

## 2023-10-03 MED ORDER — OXYCODONE-ACETAMINOPHEN 5-325 MG PO TABS: 1.0000 | ORAL_TABLET | ORAL | 0 refills | Status: DC | PRN

## 2023-10-03 MED ORDER — BUPIVACAINE-EPINEPHRINE 0.25% -1:200000 IJ SOLN
INTRAMUSCULAR | Status: DC | PRN
  Administered 2023-10-03: 30 mL

## 2023-10-03 MED ORDER — 0.9 % SODIUM CHLORIDE (POUR BTL) OPTIME
TOPICAL | Status: DC | PRN
Start: 2023-10-03 — End: 2023-10-03
  Administered 2023-10-03: 1000 mL

## 2023-10-03 SURGICAL SUPPLY — 34 items
BAG COUNTER SPONGE SURGICOUNT (BAG) IMPLANT
CABLE HIGH FREQUENCY MONO STRZ (ELECTRODE) ×2 IMPLANT
CATH URETL OPEN 5X70 (CATHETERS) IMPLANT
CHLORAPREP W/TINT 26 (MISCELLANEOUS) ×2 IMPLANT
CLIP APPLIE ROT 10 11.4 M/L (STAPLE) ×2 IMPLANT
COVER MAYO STAND XLG (MISCELLANEOUS) ×2 IMPLANT
COVER SURGICAL LIGHT HANDLE (MISCELLANEOUS) ×2 IMPLANT
DERMABOND ADVANCED .7 DNX12 (GAUZE/BANDAGES/DRESSINGS) ×2 IMPLANT
DRAPE C-ARM 42X120 X-RAY (DRAPES) IMPLANT
ELECT REM PT RETURN 15FT ADLT (MISCELLANEOUS) ×2 IMPLANT
ENDOLOOP SUT PDS II 0 18 (SUTURE) ×2 IMPLANT
GLOVE BIO SURGEON STRL SZ7.5 (GLOVE) ×2 IMPLANT
GLOVE INDICATOR 8.0 STRL GRN (GLOVE) ×2 IMPLANT
GOWN STRL REUS W/ TWL XL LVL3 (GOWN DISPOSABLE) ×2 IMPLANT
GRASPER SUT TROCAR 14GX15 (MISCELLANEOUS) IMPLANT
HEMOSTAT SNOW SURGICEL 2X4 (HEMOSTASIS) IMPLANT
IRRIGATION SUCT STRKRFLW 2 WTP (MISCELLANEOUS) ×2 IMPLANT
IV CATH 14GX2 1/4 (CATHETERS) ×2 IMPLANT
KIT BASIN OR (CUSTOM PROCEDURE TRAY) ×2 IMPLANT
KIT TURNOVER KIT A (KITS) ×2 IMPLANT
MAT PREVALON FULL STRYKER (MISCELLANEOUS) IMPLANT
NDL INSUFFLATION 14GA 120MM (NEEDLE) ×2 IMPLANT
NEEDLE INSUFFLATION 14GA 120MM (NEEDLE) ×1 IMPLANT
POUCH RETRIEVAL ECOSAC 10 (ENDOMECHANICALS) ×2 IMPLANT
SCISSORS LAP 5X35 DISP (ENDOMECHANICALS) ×2 IMPLANT
SET TUBE SMOKE EVAC HIGH FLOW (TUBING) ×2 IMPLANT
SLEEVE Z-THREAD 5X100MM (TROCAR) ×4 IMPLANT
SPIKE FLUID TRANSFER (MISCELLANEOUS) ×2 IMPLANT
STOPCOCK 4 WAY LG BORE MALE ST (IV SETS) IMPLANT
SUT MNCRL AB 4-0 PS2 18 (SUTURE) ×2 IMPLANT
TOWEL OR 17X26 10 PK STRL BLUE (TOWEL DISPOSABLE) ×2 IMPLANT
TRAY LAPAROSCOPIC (CUSTOM PROCEDURE TRAY) ×2 IMPLANT
TROCAR ADV FIXATION 12X100MM (TROCAR) ×2 IMPLANT
TROCAR Z-THREAD OPTICAL 5X100M (TROCAR) ×2 IMPLANT

## 2023-10-03 NOTE — Anesthesia Procedure Notes (Signed)
 Procedure Name: Intubation Date/Time: 10/03/2023 11:31 AM  Performed by: Brandy Almarie BROCKS, CRNAPre-anesthesia Checklist: Patient identified, Emergency Drugs available, Suction available and Patient being monitored Patient Re-evaluated:Patient Re-evaluated prior to induction Oxygen Delivery Method: Circle system utilized Preoxygenation: Pre-oxygenation with 100% oxygen Induction Type: IV induction Ventilation: Mask ventilation without difficulty Laryngoscope Size: Mac and 4 Grade View: Grade I Tube type: Oral Tube size: 7.0 mm Number of attempts: 1 Airway Equipment and Method: Stylet Placement Confirmation: ETT inserted through vocal cords under direct vision, positive ETCO2 and breath sounds checked- equal and bilateral Secured at: 22 cm Tube secured with: Tape Dental Injury: Teeth and Oropharynx as per pre-operative assessment

## 2023-10-03 NOTE — Transfer of Care (Signed)
 Immediate Anesthesia Transfer of Care Note  Patient: Nancy Villa  Procedure(s) Performed: LAPAROSCOPIC CHOLECYSTECTOMY  Patient Location: PACU  Anesthesia Type:General  Level of Consciousness: drowsy and patient cooperative  Airway & Oxygen Therapy: Patient Spontanous Breathing and Patient connected to face mask oxygen  Post-op Assessment: Report given to RN and Post -op Vital signs reviewed and stable  Post vital signs: Reviewed and stable  Last Vitals:  Vitals Value Taken Time  BP 165/99 10/03/23 13:12  Temp    Pulse 88 10/03/23 13:13  Resp 19 10/03/23 13:13  SpO2 89 % 10/03/23 13:13  Vitals shown include unfiled device data.  Last Pain:  Vitals:   10/03/23 0936  TempSrc: Oral         Complications: No notable events documented.

## 2023-10-03 NOTE — Op Note (Signed)
 Patient: Nancy Villa ([birth year], [id number])  Date of Surgery: 10/03/2023  Preoperative Diagnosis: CHOLECYSTITIS   Postoperative Diagnosis: CHOLECYSTITIS   Surgical Procedure: LAPAROSCOPIC CHOLECYSTECTOMY: 52437 (CPT)    Operative Team Members:  Surgeons and Role:    * Cerita Rabelo, Deward PARAS, MD - Primary   Anesthesiologist: Corinne Garnette BRAVO, MD CRNA: Nanci Riis, CRNA; Brandy Almarie BROCKS, CRNA   Anesthesia: General   Fluids:  Total I/O In: 1000 [I.V.:1000] Out: -   Complications: * No complications entered in OR log *  Drains:  none   Specimen:  ID Type Source Tests Collected by Time Destination  1 : Gallbladder Tissue PATH Gallbladder SURGICAL PATHOLOGY Ji Fairburn, Deward PARAS, MD 10/03/2023 1233      Disposition:  PACU - hemodynamically stable.  Plan of Care: Discharge to home after PACU    Indications for Procedure: Calie Buttrey is a 43 y.o. female who presented with abdominal pain.  History, physical and imaging was concerning for cholecystitis.  Laparoscopic cholecystectomy was recommended for the patient.  The procedure itself, as well as the risks, benefits and alternatives were discussed with the patient.  Risks discussed included but were not limited to the risk of infection, bleeding, damage to nearby structures, need to convert to open procedure, incisional hernia, bile leak, common bile duct injury and the need for additional procedures or surgeries.  With this discussion complete and all questions answered the patient granted consent to proceed.  Findings: gallstones, inflamed gallbladder  Infection status: Patient: Private Patient Elective Case Case: Elective Infection Present At Time Of Surgery (PATOS): Spillage of bile and stones   Description of Procedure:   On the date stated above, the patient was taken to the operating room suite and placed in supine positioning.  Sequential compression devices were placed on the lower extremities to  prevent blood clots.  General endotracheal anesthesia was induced. Preoperative antibiotics were given.  The patient's abdomen was prepped and draped in the usual sterile fashion.  A time-out was completed verifying the correct patient, procedure, positioning and equipment needed for the case.  We began by anesthetizing the skin with local anesthetic and then making a 5 mm incision just below the umbilicus.  We dissected through the subcutaneous tissues to the fascia.  The fascia was grasped and elevated using a Kocher clamp.  A Veress needle was inserted into the abdomen and the abdomen was insufflated to 15 mmHg.  A 5 mm trocar was inserted in this position under optical guidance and then the abdomen was inspected.  There was no trauma to the underlying viscera with initial trocar placement.  Any abnormal findings, other than inflammation in the right upper quadrant, are listed above in the findings section.  Three additional trocars were placed, one 12 mm trocar in the subxiphoid position, one 5 mm trocar in the midline epigastric area and one 5mm trocar in the right upper quadrant subcostally.  These were placed under direct vision without any trauma to the underlying viscera.    The patient was then placed in head up, left side down positioning.  The gallbladder was identified and dissected free from its attachments to the omentum allowing the duodenum to fall away.  The infundibulum of the gallbladder was dissected free working laterally to medially.  I attempted to dissect out the cystic duct and cystic artery, however due to the large size of the liver and the patient's body habitus was difficult to provide adequate lateral retraction of the infundibulum.  I decided perform  a dome down dissection.  The dome of the gallbladder was grasped and the liver was elevated and I dissected the gallbladder off the gallbladder fossa working from the fundus down to the infundibulum.  At this point I was able to  adequately retract the infundibulum of the gallbladder laterally in order to identify the cystic duct and cystic artery.  A PDS Endoloop was placed around the cystic duct and secured.  A second was placed around the infundibulum to prevent stones from falling out of the gallbladder.  I then divided the infundibulum between the 2 Endoloops.  There was some spillage of bile and stones that was contained between these 2 Endoloops during this portion of the procedure.  These were cleaned out from the field.  The gallbladder was placed in Endo Catch bag and removed through the 12 mm port site at the conclusion of the case.  The cystic duct stump was inspected.  A second PDS Endoloop was placed to ensure this was secured.  There was good hemostasis in the area as well.   The cystic plate was inspected and hemostasis was obtained using electrocautery.  A suction irrigator was used to clean the operative field.  Attention was turned to closure.  The 12 mm subxiphoid port site was closed using a 0-vicryl suture on a fascial suture passer.  The umbilical port site involved a small umbilical hernia and this was repaired with another 0 Vicryl suture figure-of-eight suture on the fascial suture passer.  The abdomen was desufflated.  The skin was closed using 4-0 monocryl and dermabond.  All sponge and needle counts were correct at the conclusion of the case.    Deward Foy, MD General, Bariatric, & Minimally Invasive Surgery [facility], [place]

## 2023-10-03 NOTE — Anesthesia Postprocedure Evaluation (Signed)
 Anesthesia Post Note  Patient: Nancy Villa  Procedure(s) Performed: LAPAROSCOPIC CHOLECYSTECTOMY     Patient location during evaluation: PACU Anesthesia Type: General Level of consciousness: awake and alert Pain management: pain level controlled Vital Signs Assessment: post-procedure vital signs reviewed and stable Respiratory status: spontaneous breathing, nonlabored ventilation, respiratory function stable and patient connected to nasal cannula oxygen Cardiovascular status: blood pressure returned to baseline and stable Postop Assessment: no apparent nausea or vomiting Anesthetic complications: no   No notable events documented.  Last Vitals:  Vitals:   10/03/23 1330 10/03/23 1332  BP: (!) 215/100 (!) 206/131  Pulse: 95 98  Resp: 17 20  Temp:    SpO2: 92% 95%    Last Pain:  Vitals:   10/03/23 1312  TempSrc:   PainSc: 0-No pain                 Garnette FORBES [place]

## 2023-10-03 NOTE — H&P (Signed)
 Admitting Physician: Deward PARAS Natilee Gauer  Service: General Surgery  CC: Cholecystitis  Subjective   HPI: Nancy Villa is an 43 y.o. female who is here for cholecystectomy  Past Medical History:  Diagnosis Date   Anxiety    Complication of anesthesia    woke up during cystoscopy   Depression    GERD (gastroesophageal reflux disease)    History of kidney stones    Hypertension    intercranial hypertension   Hypothyroidism    Intracranial hypertension     Past Surgical History:  Procedure Laterality Date   CYSTOSCOPY WITH RETROGRADE PYELOGRAM, URETEROSCOPY AND STENT PLACEMENT Right 07/19/2016   Procedure: CYSTOSCOPY WITH RIGHT RETROGRADE PYELOGRAM, RIGHT URETEROSCOPY WITH LASER LITHOTRIPSY RIGHT URETERAL CALCULUS, RIGHT URETERAL STENT PLACEMENT;  Surgeon: Sherrilee Belvie CROME, MD;  Location: [facility];  Service: Urology;  Laterality: Right;   EXTRACORPOREAL SHOCK WAVE LITHOTRIPSY Right 06/10/2016   Procedure: RIGHT EXTRACORPOREAL SHOCK WAVE LITHOTRIPSY (ESWL);  Surgeon: Belvie CROME Sherrilee, MD;  Location: [facility];  Service: Urology;  Laterality: Right;   HOLMIUM LASER APPLICATION Right 07/19/2016   Procedure: HOLMIUM LASER APPLICATION;  Surgeon: Sherrilee Belvie CROME, MD;  Location: [facility];  Service: Urology;  Laterality: Right;   WISDOM TOOTH EXTRACTION     3    History reviewed. No pertinent family history.  Social:  reports that she has been smoking cigarettes. She has a 7.5 pack-year smoking history. She has never used smokeless tobacco. She reports current alcohol use. She reports that she does not use drugs.  Allergies: No Known Allergies  Medications: Current Outpatient Medications  Medication Instructions   buPROPion (WELLBUTRIN XL) 150 mg, Oral, 2 times daily   furosemide (LASIX) 20 mg, Oral, Daily   hydrochlorothiazide (HYDRODIURIL) 25 mg, Daily   ibuprofen (ADVIL) 400 mg, Every 8 hours PRN   levothyroxine (SYNTHROID) 150 mcg, Daily before breakfast   phentermine  (ADIPEX-P) 37.5 mg, Daily before breakfast    ROS - all of the below systems have been reviewed with the patient and positives are indicated with bold text General: chills, fever or night sweats Eyes: blurry vision or double vision ENT: epistaxis or sore throat Allergy/Immunology: itchy/watery eyes or nasal congestion Hematologic/Lymphatic: bleeding problems, blood clots or swollen lymph nodes Endocrine: temperature intolerance or unexpected weight changes Breast: new or changing breast lumps or nipple discharge Resp: cough, shortness of breath, or wheezing CV: chest pain or dyspnea on exertion GI: as per HPI GU: dysuria, trouble voiding, or hematuria MSK: joint pain or joint stiffness Neuro: TIA or stroke symptoms Derm: pruritus and skin lesion changes Psych: anxiety and depression  Objective   PE Blood pressure (!) 175/110, pulse 96, temperature 98.2 F (36.8 C), temperature source Oral, resp. rate 18, height 5' 4 (1.626 m), weight (!) 177.4 kg, last menstrual period 09/08/2023, SpO2 97%. Constitutional: NAD; conversant; no deformities Eyes: Moist conjunctiva; no lid lag; anicteric; PERRL Neck: Trachea midline; no thyromegaly Lungs: Normal respiratory effort; no tactile fremitus CV: RRR; no palpable thrills; no pitting edema GI: Abd soft, nontender; no palpable hepatosplenomegaly MSK: Normal range of motion of extremities; no clubbing/cyanosis Psychiatric: Appropriate affect; alert and oriented x3 Lymphatic: No palpable cervical or axillary lymphadenopathy  Results for orders placed or performed during the hospital encounter of 10/03/23 (from the past 24 hours)  Pregnancy, urine POC     Status: None   Collection Time: 10/03/23  9:43 AM  Result Value Ref Range   Preg Test, Ur NEGATIVE NEGATIVE    Imaging Orders  No imaging studies ordered today   RUQ US  08/13/23 [facility])  1. Cholelithiasis. No definite evidence of acute cholecystitis.  2. Hepatomegaly. Liver appears  echodense. This could represent fatty liver.  3. Pancreas was not well visualized.   Assessment and Plan   Nancy Villa is an 43 y.o. female with cholelithiasis and cholecystitis Intermittent right upper quadrant discomfort and occasional pain between shoulder blades after consuming fatty foods, consistent with gallbladder attacks. Symptoms include nausea and vomiting, with a notable episode of projectile vomiting. Gallstones confirmed via ultrasound. Symptoms suggestive of cholecystitis. Surgical intervention recommended due to recurrent and significant symptoms impacting quality of life. - Schedule laparoscopic cholecystectomy - Advise on avoiding fatty foods until surgery - Prescribe Percocet for postoperative pain management - Instruct on postoperative care: no heavy lifting or strenuous activity for four weeks, allow incisions to get wet in the shower but avoid submersion until healed     Deward JINNY Foy, MD  [facility] Use [email or web address] to contact on call provider

## 2023-10-03 NOTE — Discharge Instructions (Signed)
 CHOLECYSTECTOMY POST OPERATIVE INSTRUCTIONS  Thinking Clearly  The anesthesia may cause you to feel different for 1 or 2 days. Do not drive, drink alcohol, or make any big decisions for at least 2 days.  Nutrition When you wake up, you will be able to drink small amounts of liquid. If you do not feel sick, you can slowly advance your diet to regular foods. Continue to drink lots of fluids, usually about 8 to 10 glasses per day. Eat a high-fiber diet so you don't strain during bowel movements. High-Fiber Foods Foods high in fiber include beans, bran cereals and whole-grain breads, peas, dried fruit (figs, apricots, and dates), raspberries, blackberries, strawberries, sweet corn, broccoli, baked potatoes with skin, plums, pears, apples, greens, and nuts. Activity Slowly increase your activity. Be sure to get up and walk every hour or so to prevent blood clots. No heavy lifting or strenuous activity for 4 weeks following surgery to prevent hernias at your incision sites It is normal to feel tired. You may need more sleep than usual.  Get your rest but make sure to get up and move around frequently to prevent blood clots and pneumonia.  Work and Return to [employer] can go back to work when you feel well enough. Discuss the timing with your surgeon. You can usually go back to school or work 1 week after an operation. If your work requires heavy lifting or strenuous activity you need to be placed on light duty for 4 weeks following surgery. You can return to gym class, sports or other physical activities 4 weeks after surgery.  Wound Care Always wash your hands before and after touching near your incision site. Do not soak in a bathtub until cleared at your follow up appointment. You may take a shower 24 hours after surgery. A small amount of drainage from the incision is normal. If the drainage is thick and yellow or the site is red, you may have an infection, so call your surgeon. If you  have a drain in one of your incisions, it will be taken out in office when the drainage stops. Steri-Strips will fall off in 7 to 10 days or they will be removed during your first office visit. If you have dermabond glue covering over the incision, allow the glue to flake off on its own. Avoid wearing tight or rough clothing. It may rub your incisions and make it harder for them to heal. Protect the new skin, especially from the sun. The sun can burn and cause darker scarring. Your scar will heal in about 4 to 6 weeks and will become softer and continue to fade over the next year.  The cosmetic appearance of the incisions will improve over the course of the first year after surgery. Sensation around your incision will return in a few weeks or months.  Bowel Movements After intestinal surgery, you may have loose watery stools for several days. If watery diarrhea lasts longer than 3 days, contact your surgeon. Pain medication (narcotics) can cause constipation. Increase the fiber in your diet with high-fiber foods if you are constipated. You can take an over the counter stool softener like Colace to avoid constipation.  Additional over the counter medications can also be used if Colace isn't sufficient (for example, Milk of Magnesia or Miralax).  Pain The amount of pain is different for each person. Some people need only 1 to 3 doses of pain control medication, while others need more. Take alternating doses of tylenol  and ibuprofen around the clock for the first five days following surgery.  This will provide a baseline of pain control and help with inflammation.  Take the narcotic pain medication in addition if needed for severe pain.  Contact Your Surgeon at [phone], if you have: Pain in your right upper abdomen like a gallbladder attack. Pain that will not go away Pain that gets worse A fever of more than 101F (38.3C) Repeated vomiting Swelling, redness, bleeding, or bad-smelling  drainage from your wound site Strong abdominal pain No bowel movement or unable to pass gas for 3 days Watery diarrhea lasting longer than 3 days  Pain Control The goal of pain control is to minimize pain, keep you moving and help you heal. Your surgical team will work with you on your pain plan. Most often a combination of therapies and medications are used to control your pain. You may also be given medication (local anesthetic) at the surgical site. This may help control your pain for several days. Extreme pain puts extra stress on your body at a time when your body needs to focus on healing. Do not wait until your pain has reached a level "10" or is unbearable before telling your doctor or nurse. It is much easier to control pain before it becomes severe. Following a laparoscopic procedure, pain is sometimes felt in the shoulder. This is due to the gas inserted into your abdomen during the procedure. Moving and walking helps to decrease the gas and the right shoulder pain.  Use the guide below for ways to manage your post-operative pain. Learn more by going to facs.org/safepaincontrol.  How Intense Is My Pain Common Therapies to Feel Better       I hardly notice my pain, and it does not interfere with my activities.  I notice my pain and it distracts me, but I can still do activities (sitting up, walking, standing).  Non-Medication Therapies  Ice (in a bag, applied over clothing at the surgical site), elevation, rest, meditation, massage, distraction (music, TV, play) walking and mild exercise Splinting the abdomen with pillows +  Non-Opioid Medications Acetaminophen (Tylenol) Non-steroidal anti-inflammatory drugs (NSAIDS) Aspirin, Ibuprofen (Motrin, Advil) Naproxen (Aleve) Take these as needed, when you feel pain. Both acetaminophen and NSAIDs help to decrease pain and swelling (inflammation).      My pain is hard to ignore and is more noticeable even when I rest.  My  pain interferes with my usual activities.  Non-Medication Therapies  +  Non-Opioid medications  Take on a regular schedule (around-the-clock) instead of as needed. (For example, Tylenol every 6 hours at 9:00 am, 3:00 pm, 9:00 pm, 3:00 am and Motrin every 6 hours at 12:00 am, 6:00 am, 12:00 pm, 6:00 pm)         I am focused on my pain, and I am not doing my daily activities.  I am groaning in pain, and I cannot sleep. I am unable to do anything.  My pain is as bad as it could be, and nothing else matters.  Non-Medication Therapies  +  Around-the-Clock Non-Opioid Medications  +  Short-acting opioids  Opioids should be used with other medications to manage severe pain. Opioids block pain and give a feeling of euphoria (feel high). Addiction, a serious side effect of opioids, is rare with short-term (a few days) use.  Examples of short-acting opioids include: Tramadol (Ultram), Hydrocodone (Norco, Vicodin), Hydromorphone (Dilaudid), Oxycodone (Oxycontin)     The above directions have been adapted from  the [employer] of [employer] Surgical Patient Education Program.  Please refer to the ACS website if needed: FreakyMates.de.ashx.   Ivar Drape, MD [facility] [address], [address], [place], [place]  [address] ?  [address] [address], [place], [place]   [address] [phone] ? [phone] ? FAX [phone] Web site: [email or web address]

## 2023-10-04 ENCOUNTER — Encounter (HOSPITAL_COMMUNITY): Payer: Self-pay | Admitting: Surgery

## 2023-10-04 LAB — SURGICAL PATHOLOGY

## 2023-11-18 NOTE — Patient Instructions (Signed)

## 2023-11-21 ENCOUNTER — Encounter: Payer: Self-pay | Admitting: Nurse Practitioner

## 2023-11-21 ENCOUNTER — Ambulatory Visit: Payer: Self-pay | Admitting: Nurse Practitioner

## 2023-11-21 VITALS — BP 126/78 | HR 98 | Ht 64.0 in | Wt >= 6400 oz

## 2023-11-21 DIAGNOSIS — E039 Hypothyroidism, unspecified: Secondary | ICD-10-CM | POA: Diagnosis not present

## 2023-11-21 NOTE — Progress Notes (Signed)
 Endocrinology Consult Note                                         11/21/2023, 2:00 PM  Subjective:   Subjective    Nancy Villa is a 43 y.o.-year-old female patient being seen in consultation for hypothyroidism referred by Villa, Nancy C.   Past Medical History:  Diagnosis Date   Anxiety    Complication of anesthesia    woke up during cystoscopy   Depression    GERD (gastroesophageal reflux disease)    History of kidney stones    Hypertension    intercranial hypertension   Hypothyroidism    Intracranial hypertension     Past Surgical History:  Procedure Laterality Date   CHOLECYSTECTOMY N/A 10/03/2023   Procedure: LAPAROSCOPIC CHOLECYSTECTOMY;  Surgeon: Nancy Deward PARAS, MD;  Location: [facility];  Service: General;  Laterality: N/A;   CYSTOSCOPY WITH RETROGRADE PYELOGRAM, URETEROSCOPY AND STENT PLACEMENT Right 07/19/2016   Procedure: CYSTOSCOPY WITH RIGHT RETROGRADE PYELOGRAM, RIGHT URETEROSCOPY WITH LASER LITHOTRIPSY RIGHT URETERAL CALCULUS, RIGHT URETERAL STENT PLACEMENT;  Surgeon: Nancy Belvie CROME, MD;  Location: [facility];  Service: Urology;  Laterality: Right;   EXTRACORPOREAL SHOCK WAVE LITHOTRIPSY Right 06/10/2016   Procedure: RIGHT EXTRACORPOREAL SHOCK WAVE LITHOTRIPSY (ESWL);  Surgeon: Belvie Villa Sherrilee, MD;  Location: [facility];  Service: Urology;  Laterality: Right;   HOLMIUM LASER APPLICATION Right 07/19/2016   Procedure: HOLMIUM LASER APPLICATION;  Surgeon: Nancy Belvie CROME, MD;  Location: [facility];  Service: Urology;  Laterality: Right;   WISDOM TOOTH EXTRACTION     3    Social History   Socioeconomic History   Marital status: Married    Spouse name: Not on file   Number of children: Not on file   Years of education: Not on file   Highest education level: Not on file  Occupational History   Not on file  Tobacco Use   Smoking status: Every Day    Current packs/day: 0.50    Average  packs/day: 0.5 packs/day for 15.0 years (7.5 ttl pk-yrs)    Types: Cigarettes   Smokeless tobacco: Never  Vaping Use   Vaping status: Never Used  Substance and Sexual Activity   Alcohol use: Yes    Comment: occas   Drug use: No   Sexual activity: Yes    Birth control/protection: None  Other Topics Concern   Not on file  Social History Narrative   Not on file   Social Drivers of Health   Financial Resource Strain: Patient Declined (09/27/2022)   Received from [facility]   Overall Financial Resource Strain (CARDIA)    Difficulty of Paying Living Expenses: Patient declined  Food Insecurity: No Food Insecurity (07/26/2023)   Received from [facility]   Hunger Vital Sign    Within the past 12 months, you worried that your food would run out before you got the money to buy more.: Never true    Within the past 12 months, the food you bought  just didn't last and you didn't have money to get more.: Never true  Transportation Needs: No Transportation Needs (07/26/2023)   Received from [facility] - Transportation    Lack of Transportation (Medical): No    Lack of Transportation (Non-Medical): No  Physical Activity: Not on file  Stress: Not on file  Social Connections: Not on file    History reviewed. No pertinent family history.  Outpatient Encounter Medications as of 11/21/2023  Medication Sig   buPROPion (WELLBUTRIN XL) 150 MG 24 hr tablet Take 150 mg by mouth 2 (two) times daily.   furosemide (LASIX) 20 MG tablet Take 20 mg by mouth daily.   hydrochlorothiazide (HYDRODIURIL) 25 MG tablet Take 25 mg by mouth daily.   ibuprofen (ADVIL,MOTRIN) 200 MG tablet Take 400 mg by mouth every 8 (eight) hours as needed (for pain/headache).   levothyroxine  (SYNTHROID ) 150 MCG tablet Take 150 mcg by mouth daily before breakfast.   phentermine (ADIPEX-P) 37.5 MG tablet Take 37.5 mg by mouth daily before breakfast. (Patient not taking: Reported on 11/21/2023)   [DISCONTINUED]  oxyCODONE -acetaminophen  (PERCOCET) 5-325 MG tablet Take 1 tablet by mouth every 4 (four) hours as needed for severe pain (pain score 7-10). (Patient not taking: Reported on 11/21/2023)   No facility-administered encounter medications on file as of 11/21/2023.    ALLERGIES: No Known Allergies VACCINATION STATUS:  There is no immunization history on file for this patient.   HPI   Nancy Villa  is a patient with the above medical history.  She is accompanied by her mother today.  she was diagnosed with hypothyroidism at approximate age of 27 years, which required subsequent initiation of thyroid  hormone replacement therapy. she was given various doses of Levothyroxine  over the years, currently on 150 micrograms. she reports compliance to this medication but she does take it with her other medications in the morning.  I reviewed patient's thyroid  tests:  Lab Results  Component Value Date   TSH 7.19 (A) 07/18/2023     Pt describes: - weight gain - fatigue - depression - constipation - dry skin - hair loss  Pt denies feeling nodules in neck, hoarseness, dysphagia/odynophagia, SOB with lying down.  she does have family history of thyroid  disorders in her maternal grandmother and maternal aunt.  No family history of thyroid  cancer.  No history of radiation therapy to head or neck.  No recent use of iodine supplements.  Denies use of Biotin containing supplements.  I reviewed her chart and she also has a history of HLD, obesity, tobacco use.   ROS:  Constitutional: + weight gain, + fatigue, no subjective hyperthermia, no subjective hypothermia Eyes: no blurry vision, no xerophthalmia ENT: no sore throat, no nodules palpated in throat, no dysphagia/odynophagia, no hoarseness Cardiovascular: no chest pain, no SOB, no palpitations, no leg swelling Respiratory: no cough, no SOB Gastrointestinal: no nausea/vomiting/diarrhea Musculoskeletal: + diffuse muscle/joint aches Skin: no rashes,  + dry skin Neurological: no tremors, no numbness, no tingling, no dizziness Psychiatric: + depression, no anxiety   Objective:   Objective     BP 126/78 (BP Location: Right Arm, Patient Position: Sitting)   Pulse 98   Ht 5' 4 (1.626 m)   Wt (!) 400 lb 9.6 oz (181.7 kg)   LMP 11/12/2023   BMI 68.76 kg/m  Wt Readings from Last 3 Encounters:  11/21/23 (!) 400 lb 9.6 oz (181.7 kg)  10/03/23 (!) 391 lb (177.4 kg)  09/29/23 (!) 391 lb (177.4 kg)  BP Readings from Last 3 Encounters:  11/21/23 126/78  10/03/23 110/70  09/29/23 (!) 159/117     Constitutional:  Body mass index is 68.76 kg/m., not in acute distress, normal state of mind Eyes: PERRLA, EOMI, no exophthalmos ENT: moist mucous membranes, no thyromegaly, no cervical lymphadenopathy Cardiovascular: normal precordial activity, RRR, no murmur/rubs/gallops Respiratory:  adequate breathing efforts, no gross chest deformity, Clear to auscultation bilaterally Gastrointestinal: abdomen soft, non-tender, no distension, bowel sounds present Musculoskeletal: no gross deformities, strength intact in all four extremities Skin: moist, warm, no rashes Neurological: no tremor with outstretched hands, deep tendon reflexes normal in BLE.   CMP ( most recent) CMP     Component Value Date/Time   NA 139 09/29/2023 1408   K 3.0 (L) 09/29/2023 1408   CL 101 09/29/2023 1408   CO2 26 09/29/2023 1408   GLUCOSE 103 (H) 09/29/2023 1408   BUN 13 09/29/2023 1408   CREATININE 0.92 09/29/2023 1408   CALCIUM 9.4 09/29/2023 1408   GFRNONAA >60 09/29/2023 1408     Diabetic Labs (most recent): No results found for: HGBA1C, MICROALBUR   Lipid Panel ( most recent) Lipid Panel  No results found for: CHOL, TRIG, HDL, CHOLHDL, VLDL, LDLCALC, LDLDIRECT, LABVLDL      Assessment & Plan:   ASSESSMENT / PLAN:  1. Hypothyroidism-unspecified  Patient with long-standing hypothyroidism, on levothyroxine  therapy. On  physical exam, patient does not have gross goiter, thyroid  nodules, or neck compression symptoms. She is advised to continue Levothyroxine  150 mcg po daily at breakfast for now but to start taking it properly.  - We discussed about correct intake of levothyroxine , at fasting, with water , separated by at least 30 minutes from breakfast, and separated by more than 4 hours from calcium, iron, multivitamins, acid reflux medications (PPIs). -Patient is made aware of the fact that thyroid  hormone replacement is needed for life, dose to be adjusted by periodic monitoring of thyroid  function tests.  - Will check thyroid  tests before next visit: TSH, free T4, and thyroid  antibodies to help classify her dysfunction.  -Due to absence of clinical goiter, no need for thyroid  ultrasound.  -We did discuss potential for weight loss management as well.  She has been seeing a nutritionist.  She does smoke, which increases likelihood of pancreatitis on GLP1 products.  This may be hard to get covered for her without type 2 diabetes diagnosis.  She will ask her PCP for referral for sleep study for potential OSA.   - Time spent with the patient: 45 minutes,which was spent in obtaining information about her symptoms, reviewing her previous labs, evaluations, and treatments, counseling her about her hypothyroidism, and developing a plan to confirm the diagnosis and long term treatment as necessary. Please refer to Patient Self Inventory in the Media tab for reviewed elements of pertinent patient history.  Nancy Villa participated in the discussions, expressed understanding, and voiced agreement with the above plans.  All questions were answered to her satisfaction. she is encouraged to contact clinic should she have any questions or concerns prior to her return visit.   FOLLOW UP PLAN:  Return will call with thyroid  labs and next steps.  Benton Rio, [facility] [address] [place], [place] [address] Phone: [phone] Fax: [phone]  11/21/2023, 2:00 PM

## 2023-11-22 ENCOUNTER — Ambulatory Visit: Payer: Self-pay | Admitting: Nurse Practitioner

## 2023-11-22 DIAGNOSIS — E063 Autoimmune thyroiditis: Secondary | ICD-10-CM

## 2023-11-22 LAB — THYROGLOBULIN ANTIBODY: Thyroglobulin Antibody: 45.3 [IU]/mL — ABNORMAL HIGH (ref 0.0–0.9)

## 2023-11-22 LAB — THYROID PEROXIDASE ANTIBODY: Thyroperoxidase Ab SerPl-aCnc: 263 [IU]/mL — ABNORMAL HIGH (ref 0–34)

## 2023-11-22 LAB — TSH: TSH: 6.08 u[IU]/mL — ABNORMAL HIGH (ref 0.450–4.500)

## 2023-11-22 LAB — T4, FREE: Free T4: 1.33 ng/dL (ref 0.82–1.77)

## 2023-11-22 MED ORDER — LEVOTHYROXINE SODIUM 175 MCG PO TABS: 175.0000 ug | ORAL_TABLET | Freq: Every day | ORAL | 1 refills | Status: DC

## 2023-11-22 NOTE — Addendum Note (Signed)
 Addended by: Tyronica Truxillo J on: 11/22/2023 09:53 AM   Modules accepted: Orders

## 2023-11-22 NOTE — Telephone Encounter (Signed)
 Can you schedule patient for follow up in 3  months with previsit labs please?  Thanks

## 2023-11-22 NOTE — Progress Notes (Signed)
 FYI: I sent mychart message going over thyroid labs.

## 2023-11-23 NOTE — Telephone Encounter (Signed)
 See patient response.

## 2024-02-17 LAB — T4, FREE: Free T4: 1.44 ng/dL (ref 0.82–1.77)

## 2024-02-17 LAB — TSH: TSH: 8.03 u[IU]/mL — ABNORMAL HIGH (ref 0.450–4.500)

## 2024-02-21 NOTE — Patient Instructions (Signed)

## 2024-02-22 ENCOUNTER — Ambulatory Visit: Admitting: Nurse Practitioner

## 2024-02-22 ENCOUNTER — Encounter: Payer: Self-pay | Admitting: Nurse Practitioner

## 2024-02-22 VITALS — BP 128/88 | HR 90 | Ht 64.0 in | Wt >= 6400 oz

## 2024-02-22 DIAGNOSIS — K760 Fatty (change of) liver, not elsewhere classified: Secondary | ICD-10-CM | POA: Diagnosis not present

## 2024-02-22 DIAGNOSIS — E66813 Obesity, class 3: Secondary | ICD-10-CM

## 2024-02-22 DIAGNOSIS — E063 Autoimmune thyroiditis: Secondary | ICD-10-CM

## 2024-02-22 DIAGNOSIS — Z6841 Body Mass Index (BMI) 40.0 and over, adult: Secondary | ICD-10-CM

## 2024-02-22 DIAGNOSIS — G4733 Obstructive sleep apnea (adult) (pediatric): Secondary | ICD-10-CM | POA: Diagnosis not present

## 2024-02-22 MED ORDER — LEVOTHYROXINE SODIUM 200 MCG PO TABS: 200.0000 ug | ORAL_TABLET | Freq: Every day | ORAL | 1 refills | Status: AC

## 2024-02-22 NOTE — Progress Notes (Signed)
 Endocrinology Follow Up Note                                         02/22/2024, 11:15 AM  Subjective:   Subjective    Nancy Villa is a 43 y.o.-year-old female patient being seen in follow up after being seen in consultation for hypothyroidism referred by Hairfield, Keavie C.   Past Medical History:  Diagnosis Date   Anxiety    Complication of anesthesia    woke up during cystoscopy   Depression    GERD (gastroesophageal reflux disease)    History of kidney stones    Hypertension    intercranial hypertension   Hypothyroidism    Intracranial hypertension     Past Surgical History:  Procedure Laterality Date   CHOLECYSTECTOMY N/A 10/03/2023   Procedure: LAPAROSCOPIC CHOLECYSTECTOMY;  Surgeon: Lyndel Deward PARAS, MD;  Location: [facility];  Service: General;  Laterality: N/A;   CYSTOSCOPY WITH RETROGRADE PYELOGRAM, URETEROSCOPY AND STENT PLACEMENT Right 07/19/2016   Procedure: CYSTOSCOPY WITH RIGHT RETROGRADE PYELOGRAM, RIGHT URETEROSCOPY WITH LASER LITHOTRIPSY RIGHT URETERAL CALCULUS, RIGHT URETERAL STENT PLACEMENT;  Surgeon: Sherrilee Belvie CROME, MD;  Location: [facility];  Service: Urology;  Laterality: Right;   EXTRACORPOREAL SHOCK WAVE LITHOTRIPSY Right 06/10/2016   Procedure: RIGHT EXTRACORPOREAL SHOCK WAVE LITHOTRIPSY (ESWL);  Surgeon: Belvie CROME Sherrilee, MD;  Location: [facility];  Service: Urology;  Laterality: Right;   HOLMIUM LASER APPLICATION Right 07/19/2016   Procedure: HOLMIUM LASER APPLICATION;  Surgeon: Sherrilee Belvie CROME, MD;  Location: [facility];  Service: Urology;  Laterality: Right;   WISDOM TOOTH EXTRACTION     3    Social History   Socioeconomic History   Marital status: Married    Spouse name: Not on file   Number of children: Not on file   Years of education: Not on file   Highest education level: Not on file  Occupational History   Not on file  Tobacco Use   Smoking status: Every Day     Current packs/day: 0.50    Average packs/day: 0.5 packs/day for 15.0 years (7.5 ttl pk-yrs)    Types: Cigarettes   Smokeless tobacco: Never  Vaping Use   Vaping status: Never Used  Substance and Sexual Activity   Alcohol use: Yes    Comment: occas   Drug use: No   Sexual activity: Yes    Birth control/protection: None  Other Topics Concern   Not on file  Social History Narrative   Not on file   Social Drivers of Health   Financial Resource Strain: Patient Declined (09/27/2022)   Received from [facility]   Overall Financial Resource Strain (CARDIA)    Difficulty of Paying Living Expenses: Patient declined  Food Insecurity: No Food Insecurity (07/26/2023)   [facility]   Hunger Vital Sign    Within the past 12 months, you worried that your food would run out before you got the money to buy more.: Never true    Within the  past 12 months, the food you bought just didn't last and you didn't have money to get more.: Never true  Transportation Needs: No Transportation Needs (07/26/2023)   Received from [facility] - [facility]    Lack of Transportation (Medical): No    Lack of Transportation (Non-Medical): No  Physical Activity: Not on file  Stress: Not on file  Social Connections: Not on file    History reviewed. No pertinent family history.  Outpatient Encounter Medications as of 02/22/2024  Medication Sig   buPROPion (WELLBUTRIN XL) 150 MG 24 hr tablet Take 150 mg by mouth 2 (two) times daily.   furosemide (LASIX) 20 MG tablet Take 20 mg by mouth daily.   hydrochlorothiazide (HYDRODIURIL) 25 MG tablet Take 25 mg by mouth daily.   ibuprofen (ADVIL,MOTRIN) 200 MG tablet Take 400 mg by mouth every 8 (eight) hours as needed (for pain/headache).   [DISCONTINUED] levothyroxine  (SYNTHROID ) 175 MCG tablet Take 1 tablet (175 mcg total) by mouth daily before breakfast.   levothyroxine  (SYNTHROID ) 200 MCG tablet Take 1 tablet (200 mcg total) by  mouth daily before breakfast.   [DISCONTINUED] phentermine (ADIPEX-P) 37.5 MG tablet Take 37.5 mg by mouth daily before breakfast. (Patient not taking: Reported on 02/22/2024)   No facility-administered encounter medications on file as of 02/22/2024.    ALLERGIES: No Known Allergies VACCINATION STATUS:  There is no immunization history on file for this patient.   HPI   Nancy Villa  is a patient with the above medical history.  She is accompanied by her mother today.  she was diagnosed with hypothyroidism at approximate age of 27 years, which required subsequent initiation of thyroid  hormone replacement therapy. she was given various doses of Levothyroxine  over the years, currently on 150 micrograms. she reports compliance to this medication but she does take it with her other medications in the morning.  I reviewed patient's thyroid  tests:  Lab Results  Component Value Date   TSH 8.030 (H) 02/16/2024   TSH 6.080 (H) 11/21/2023   TSH 7.19 (A) 07/18/2023   FREET4 1.44 02/16/2024   FREET4 1.33 11/21/2023     Pt denies feeling nodules in neck, hoarseness, dysphagia/odynophagia, SOB with lying down.  she does have family history of thyroid  disorders in her maternal grandmother and maternal aunt.  No family history of thyroid  cancer.  No history of radiation therapy to head or neck.  No recent use of iodine supplements.  Denies use of Biotin containing supplements.  I reviewed her chart and she also has a history of HLD, obesity, tobacco use.   ROS:  Constitutional: + weight gain, + fatigue, no subjective hyperthermia, + subjective hypothermia Eyes: no blurry vision, no xerophthalmia ENT: no sore throat, no nodules palpated in throat, no dysphagia/odynophagia, no hoarseness Cardiovascular: no chest pain, no SOB, no palpitations, no leg swelling Respiratory: no cough, no SOB Gastrointestinal: no nausea/vomiting/diarrhea Musculoskeletal: + diffuse muscle/joint aches Skin: no  rashes, + dry skin Neurological: no tremors, no numbness, no tingling, no dizziness Psychiatric: + depression, no anxiety   Objective:   Objective     BP 128/88 (BP Location: Left Arm, Patient Position: Sitting, Cuff Size: Large)   Pulse 90   Ht 5' 4 (1.626 m)   Wt (!) 411 lb 6.4 oz (186.6 kg)   BMI 70.62 kg/m  Wt Readings from Last 3 Encounters:  02/22/24 (!) 411 lb 6.4 oz (186.6 kg)  11/21/23 (!) 400 lb 9.6 oz (181.7 kg)  10/03/23 ROLLEN)  391 lb (177.4 kg)    BP Readings from Last 3 Encounters:  02/22/24 128/88  11/21/23 126/78  10/03/23 110/70      Physical Exam- Limited  Constitutional:  Body mass index is 70.62 kg/m. , not in acute distress, normal state of mind Eyes:  EOMI, no exophthalmos Musculoskeletal: no gross deformities, strength intact in all four extremities, no gross restriction of joint movements Skin:  no rashes, no hyperemia Neurological: no tremor with outstretched hands   CMP ( most recent) CMP     Component Value Date/Time   NA 139 09/29/2023 1408   K 3.0 (L) 09/29/2023 1408   CL 101 09/29/2023 1408   CO2 26 09/29/2023 1408   GLUCOSE 103 (H) 09/29/2023 1408   BUN 13 09/29/2023 1408   CREATININE 0.92 09/29/2023 1408   CALCIUM 9.4 09/29/2023 1408   GFRNONAA >60 09/29/2023 1408     Diabetic Labs (most recent): No results found for: HGBA1C, MICROALBUR   Lipid Panel ( most recent) Lipid Panel  No results found for: CHOL, TRIG, HDL, CHOLHDL, VLDL, LDLCALC, LDLDIRECT, LABVLDL      Assessment & Plan:   ASSESSMENT / PLAN:  1. Hypothyroidism-r/t Hashimotos thyroiditis  Patient with long-standing hypothyroidism, on levothyroxine  therapy. On physical exam, patient does not have gross goiter, thyroid  nodules, or neck compression symptoms.  Her thyroid  antibodies were positive, indicating autoimmune origin.  Her previsit TFTs are consistent with under-replacement and she does have symptoms of such.  She is advised to  increase Levothyroxine  to 200 mcg po daily before breakfast.  Will recheck TFTs prior to next visit and adjust dose accordingly.  - We discussed about correct intake of levothyroxine , at fasting, with water , separated by at least 30 minutes from breakfast, and separated by more than 4 hours from calcium, iron, multivitamins, acid reflux medications (PPIs). -Patient is made aware of the fact that thyroid  hormone replacement is needed for life, dose to be adjusted by periodic monitoring of thyroid  function tests.  -Due to absence of clinical goiter, no need for thyroid  ultrasound.  2.Class 3 severe obesity with serous comorbidity and BMI greater than 70  -We did discuss potential for weight loss management as well.  She has been seeing a nutritionist.  She does smoke, which increases likelihood of pancreatitis on GLP1 products but notes she is cutting back (down to 1/2 pack per day).  This may be hard to get covered for her without type 2 diabetes diagnosis.  She recently had sleep study showing she does have OSA, now using Cpap at night.  She also has fatty liver disease.  We may now have the data to support her need for this medication.  I have asked patient to have her sleep study results sent to our office as well as the imaging showing fatty liver disease.  Once I get this data, will try sending in for Zepbound.     I spent  41  minutes in the care of the patient today including review of labs from Thyroid  Function, CMP, and other relevant labs ; imaging/biopsy records (current and previous including abstractions from other facilities); face-to-face time discussing  her lab results and symptoms, medications doses, her options of short and long term treatment based on the latest standards of care / guidelines;   and documenting the encounter.  Britany Umholtz  participated in the discussions, expressed understanding, and voiced agreement with the above plans.  All questions were answered to her  satisfaction. she is encouraged to  contact clinic should she have any questions or concerns prior to her return visit.   FOLLOW UP PLAN:  Return in about 4 months (around 06/22/2024) for Thyroid  follow up, Previsit labs.  Benton Rio, [facility] [address] [place], [place] [address] Phone: [phone] Fax: [phone]  02/22/2024, 11:15 AM

## 2024-02-23 MED ORDER — ZEPBOUND 2.5 MG/0.5ML ~~LOC~~ SOAJ: 2.5000 mg | SUBCUTANEOUS | 0 refills | Status: DC

## 2024-02-23 NOTE — Addendum Note (Signed)
 Addended by: Harjot Dibello J on: 02/23/2024 07:07 AM   Modules accepted: Orders

## 2024-03-01 ENCOUNTER — Telehealth: Payer: Self-pay | Admitting: *Deleted

## 2024-03-01 NOTE — Telephone Encounter (Signed)
 Patient called to follow up on her Zepbound  prescription. Patient has had a sleep study and this per Benton showed that the patient has Sleep Apnea and other study showed Fatty Liver.  Will contact PA team and ask that they will move forward with a PA for the Zepbound . Patient will be called and made aware.

## 2024-03-02 ENCOUNTER — Telehealth: Payer: Self-pay

## 2024-03-02 ENCOUNTER — Other Ambulatory Visit (HOSPITAL_COMMUNITY): Payer: Self-pay

## 2024-03-02 NOTE — Telephone Encounter (Signed)
 Pharmacy Patient Advocate Encounter   Received notification from Pt Calls Messages that prior authorization for Zepbound  is required/requested.   Insurance verification completed.   The patient is insured through [employer].   Per test claim: PA required; PA started via CoverMyMeds. KEY [id number] . Waiting for clinical questions to populate.

## 2024-03-05 NOTE — Telephone Encounter (Signed)
Looks like PA is pending

## 2024-03-05 NOTE — Telephone Encounter (Signed)
 Thank You.

## 2024-03-05 NOTE — Telephone Encounter (Signed)
 What are they needing?

## 2024-03-05 NOTE — Telephone Encounter (Signed)
 Any updates about the patient's Zepound?

## 2024-03-05 NOTE — Telephone Encounter (Signed)
 Additional information has been requested from the patient's insurance in order to proceed with the prior authorization request. Requested information has been sent

## 2024-03-12 NOTE — Telephone Encounter (Signed)
 Has there been any determination in this case?  Patient has called back to inquire again.

## 2024-03-12 NOTE — Telephone Encounter (Signed)
 Pharmacy Patient Advocate Encounter  Received notification from [facility] that Prior Authorization for Zepbound  2.5MG /0.5ML pen-injectors has been DENIED.  See denial reason below. No denial letter attached in CMM. Will attach denial letter to Media tab once received.  Prescription benefit plan does not cover Zepbound ; medication is contractually excluded from coverage under [employer]    PA #/Case ID/Reference #: [id number]

## 2024-03-12 NOTE — Telephone Encounter (Signed)
 This is just our smart phrase stating that the clinical questions attached to the PA have populated and were answered and sent back. I also attached chart notes and sleep study.  Additional information was sent in to support the request. Will follow up with the PA team for a status.

## 2024-03-13 ENCOUNTER — Telehealth: Payer: Self-pay | Admitting: *Deleted

## 2024-03-13 MED ORDER — TIRZEPATIDE 2.5 MG/0.5ML ~~LOC~~ SOAJ: 2.5000 mg | SUBCUTANEOUS | 0 refills | Status: AC

## 2024-03-13 MED ORDER — TIRZEPATIDE-WEIGHT MANAGEMENT 5 MG/0.5ML ~~LOC~~ SOLN: 5.0000 mg | SUBCUTANEOUS | 3 refills | Status: AC

## 2024-03-13 MED ORDER — TIRZEPATIDE-WEIGHT MANAGEMENT 2.5 MG/0.5ML ~~LOC~~ SOLN: 2.5000 mg | SUBCUTANEOUS | 1 refills | Status: AC

## 2024-03-13 NOTE — Telephone Encounter (Signed)
 Patient was called and made aware.

## 2024-03-13 NOTE — Telephone Encounter (Signed)
 I sent the Tirzepatide  vials to [employer],  I sent in for the first 2 doses.  She will start with 2.5 mg SQ weekly x 1 month, then increase to the 5 mg weekly thereafter.  We can work on the Mounjaro on the back end to see if it will be covered for her OSA.

## 2024-03-13 NOTE — Telephone Encounter (Signed)
 It was denied.

## 2024-03-13 NOTE — Telephone Encounter (Signed)
Patient was called and a voicemail was left with this information.

## 2024-03-13 NOTE — Addendum Note (Signed)
 Addended by: Elia Keenum J on: 03/13/2024 06:54 AM   Modules accepted: Orders

## 2024-03-13 NOTE — Telephone Encounter (Signed)
 I sent in for Mounjaro to see if that one could be covered for the sleep apnea in her case since Zepbound  was denied.  Its a long shot, but one worth trying.

## 2024-03-13 NOTE — Telephone Encounter (Signed)
 Patient left a message that she would like for a prescription be called in to [employer], she is going to pay out of her pocket. She ask that in the memo you put her phone number [phone].  I am going to call and share about the Mounjaro as well.

## 2024-06-22 ENCOUNTER — Ambulatory Visit: Admitting: Nurse Practitioner
